# Patient Record
Sex: Male | Born: 1960 | Race: White | Hispanic: No | Marital: Married | Smoking: Never smoker
Health system: Southern US, Community
[De-identification: ages and names within clinical notes are randomized; demographics above are authoritative.]

## PROBLEM LIST (undated history)

## (undated) DIAGNOSIS — I2510 Atherosclerotic heart disease of native coronary artery without angina pectoris: Secondary | ICD-10-CM

## (undated) DIAGNOSIS — K219 Gastro-esophageal reflux disease without esophagitis: Secondary | ICD-10-CM

## (undated) DIAGNOSIS — C61 Malignant neoplasm of prostate: Secondary | ICD-10-CM

## (undated) DIAGNOSIS — F419 Anxiety disorder, unspecified: Secondary | ICD-10-CM

## (undated) DIAGNOSIS — E119 Type 2 diabetes mellitus without complications: Secondary | ICD-10-CM

## (undated) DIAGNOSIS — Z972 Presence of dental prosthetic device (complete) (partial): Secondary | ICD-10-CM

## (undated) DIAGNOSIS — M1990 Unspecified osteoarthritis, unspecified site: Secondary | ICD-10-CM

## (undated) DIAGNOSIS — Z973 Presence of spectacles and contact lenses: Secondary | ICD-10-CM

## (undated) DIAGNOSIS — I10 Essential (primary) hypertension: Secondary | ICD-10-CM

## (undated) DIAGNOSIS — E785 Hyperlipidemia, unspecified: Secondary | ICD-10-CM

## (undated) HISTORY — DX: Anxiety disorder, unspecified: F41.9

## (undated) HISTORY — DX: Type 2 diabetes mellitus without complications: E11.9

## (undated) HISTORY — PX: TENDON REPAIR: SHX5111

## (undated) HISTORY — PX: HERNIA REPAIR: SHX51

---

## 1998-09-20 ENCOUNTER — Emergency Department (HOSPITAL_COMMUNITY): Admission: EM | Admit: 1998-09-20 | Discharge: 1998-09-20 | Payer: Self-pay | Admitting: Emergency Medicine

## 2007-07-15 ENCOUNTER — Emergency Department (HOSPITAL_COMMUNITY): Admission: EM | Admit: 2007-07-15 | Discharge: 2007-07-15 | Payer: Self-pay | Admitting: Emergency Medicine

## 2009-06-13 ENCOUNTER — Emergency Department (HOSPITAL_COMMUNITY): Admission: EM | Admit: 2009-06-13 | Discharge: 2009-06-13 | Payer: Self-pay | Admitting: Emergency Medicine

## 2011-03-12 LAB — GLUCOSE, CAPILLARY: Glucose-Capillary: 110 mg/dL — ABNORMAL HIGH (ref 70–99)

## 2011-09-18 LAB — URINALYSIS, ROUTINE W REFLEX MICROSCOPIC
Hgb urine dipstick: NEGATIVE
Nitrite: NEGATIVE
Protein, ur: NEGATIVE
Specific Gravity, Urine: <1.005 — ABNORMAL LOW
Urobilinogen, UA: 0.2

## 2015-07-22 ENCOUNTER — Emergency Department (HOSPITAL_COMMUNITY)
Admission: EM | Admit: 2015-07-22 | Discharge: 2015-07-22 | Disposition: A | Discharge status: Home / Self Care | Payer: Worker's Compensation | Attending: Physician Assistant | Admitting: Physician Assistant

## 2015-07-22 ENCOUNTER — Encounter (HOSPITAL_COMMUNITY): Payer: Self-pay | Admitting: Emergency Medicine

## 2015-07-22 DIAGNOSIS — Z79899 Other long term (current) drug therapy: Secondary | ICD-10-CM | POA: Diagnosis not present

## 2015-07-22 DIAGNOSIS — Y9289 Other specified places as the place of occurrence of the external cause: Secondary | ICD-10-CM | POA: Insufficient documentation

## 2015-07-22 DIAGNOSIS — Z880 Allergy status to penicillin: Secondary | ICD-10-CM | POA: Insufficient documentation

## 2015-07-22 DIAGNOSIS — Y998 Other external cause status: Secondary | ICD-10-CM | POA: Insufficient documentation

## 2015-07-22 DIAGNOSIS — S0592XA Unspecified injury of left eye and orbit, initial encounter: Secondary | ICD-10-CM

## 2015-07-22 DIAGNOSIS — W228XXA Striking against or struck by other objects, initial encounter: Secondary | ICD-10-CM | POA: Diagnosis not present

## 2015-07-22 DIAGNOSIS — Y9389 Activity, other specified: Secondary | ICD-10-CM | POA: Insufficient documentation

## 2015-07-22 DIAGNOSIS — I10 Essential (primary) hypertension: Secondary | ICD-10-CM | POA: Diagnosis not present

## 2015-07-22 HISTORY — DX: Essential (primary) hypertension: I10

## 2015-07-22 MED ORDER — ERYTHROMYCIN 5 MG/GM OP OINT
TOPICAL_OINTMENT | Freq: Four times a day (QID) | OPHTHALMIC | Status: DC
  Administered 2015-07-22: 20:00:00 via OPHTHALMIC
  Filled 2015-07-22: qty 3.5

## 2015-07-22 MED ORDER — FLUORESCEIN SODIUM 1 MG OP STRP
ORAL_STRIP | OPHTHALMIC | Status: AC
  Administered 2015-07-22: 20:00:00
  Filled 2015-07-22: qty 1

## 2015-07-22 MED ORDER — OXYCODONE-ACETAMINOPHEN 5-325 MG PO TABS: 2.0000 | ORAL_TABLET | ORAL | Status: DC | PRN

## 2015-07-22 MED ORDER — TETRACAINE HCL 0.5 % OP SOLN
1.0000 [drp] | Freq: Once | OPHTHALMIC | Status: AC
  Administered 2015-07-22: 1 [drp] via OPHTHALMIC

## 2015-07-22 MED ORDER — TETRACAINE HCL 0.5 % OP SOLN
OPHTHALMIC | Status: AC
  Administered 2015-07-22: 1 [drp] via OPHTHALMIC
  Filled 2015-07-22: qty 2

## 2015-07-22 NOTE — ED Notes (Signed)
Irrigated r eye with 68ml nss.  Pt tolerated well.
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
## 2015-07-22 NOTE — Discharge Instructions (Signed)
Use the eye ointment every 6 hours and follow up with your eye doctor tomorrow. Take ibuprofen for pain and inflammation. Do not take the narcotic if driving as it will make you sleepy.
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
## 2015-08-04 MED FILL — Oxycodone w/ Acetaminophen Tab 5-325 MG: ORAL | Qty: 6 | Status: AC

## 2020-06-30 DIAGNOSIS — E1142 Type 2 diabetes mellitus with diabetic polyneuropathy: Secondary | ICD-10-CM | POA: Insufficient documentation

## 2020-06-30 DIAGNOSIS — E782 Mixed hyperlipidemia: Secondary | ICD-10-CM | POA: Insufficient documentation

## 2020-06-30 DIAGNOSIS — I10 Essential (primary) hypertension: Secondary | ICD-10-CM | POA: Insufficient documentation

## 2020-06-30 DIAGNOSIS — E1122 Type 2 diabetes mellitus with diabetic chronic kidney disease: Secondary | ICD-10-CM | POA: Insufficient documentation

## 2020-06-30 DIAGNOSIS — E669 Obesity, unspecified: Secondary | ICD-10-CM | POA: Insufficient documentation

## 2020-08-30 ENCOUNTER — Other Ambulatory Visit: Payer: Self-pay

## 2020-08-30 ENCOUNTER — Encounter: Payer: Self-pay | Admitting: Urology

## 2020-08-30 ENCOUNTER — Ambulatory Visit (INDEPENDENT_AMBULATORY_CARE_PROVIDER_SITE_OTHER): Payer: BC Managed Care – PPO | Admitting: Urology

## 2020-08-30 VITALS — BP 144/78 | HR 71 | Temp 98.0°F | Ht 73.0 in | Wt 257.0 lb

## 2020-08-30 DIAGNOSIS — R972 Elevated prostate specific antigen [PSA]: Secondary | ICD-10-CM | POA: Insufficient documentation

## 2020-08-30 LAB — URINALYSIS, ROUTINE W REFLEX MICROSCOPIC
Bilirubin, UA: NEGATIVE
Ketones, UA: NEGATIVE
Leukocytes,UA: NEGATIVE
Nitrite, UA: NEGATIVE
Protein,UA: NEGATIVE
RBC, UA: NEGATIVE
Specific Gravity, UA: 1.02 (ref 1.005–1.030)
Urobilinogen, Ur: 1 mg/dL (ref 0.2–1.0)
pH, UA: 5.5 (ref 5.0–7.5)

## 2020-08-30 LAB — BLADDER SCAN AMB NON-IMAGING: Scan Result: 28

## 2020-08-30 MED ORDER — DIAZEPAM 10 MG PO TABS: 10.0000 mg | ORAL_TABLET | Freq: Once | ORAL | 0 refills | Status: AC

## 2020-08-30 MED ORDER — LEVOFLOXACIN 750 MG PO TABS: 750.0000 mg | ORAL_TABLET | Freq: Every day | ORAL | 0 refills | Status: DC

## 2020-08-30 NOTE — Patient Instructions (Addendum)
Transrectal Ultrasound-Guided Prostate Biopsy A transrectal ultrasound-guided prostate biopsy is a procedure to remove samples of prostate tissue for testing. The procedure uses ultrasound images to guide the process of removing the samples. The samples are taken to a lab to be checked for prostate cancer. This procedure is usually done to evaluate the prostate gland of men who have raised (elevated) levels of prostate-specific antigen (PSA), which can be a sign of prostate cancer. Tell a health care provider about:  Any allergies you have.  All medicines you are taking, including vitamins, herbs, eye drops, creams, and over-the-counter medicines.  Any problems you or family members have had with anesthetic medicines.  Any blood disorders you have.  Any surgeries you have had.  Any medical conditions you have. What are the risks? Generally, this is a safe procedure. However, problems may occur, including:  Prostate infection.  Bleeding from the rectum.  Blood in the urine.  Allergic reactions to medicines.  Damage to surrounding structures such as blood vessels, organs, or muscles.  Difficulty passing urine.  Nerve damage. This is usually temporary.  Pain. What happens before the procedure? Staying hydrated Follow instructions from your health care provider about hydration, which may include:  Up to 2 hours before the procedure - you may continue to drink clear liquids, such as water, clear fruit juice, black coffee, and plain tea.  Eating and drinking restrictions Follow instructions from your health care provider about eating and drinking, which may include:  8 hours before the procedure - stop eating heavy meals or foods such as meat, fried foods, or fatty foods.  6 hours before the procedure - stop eating light meals or foods, such as toast or cereal.  6 hours before the procedure - stop drinking milk or drinks that contain milk.  2 hours before the procedure -  stop drinking clear liquids. Medicines Ask your health care provider about:  Changing or stopping your regular medicines. This is especially important if you are taking diabetes medicines or blood thinners.  Taking over-the-counter medicines, vitamins, herbs, and supplements.  Taking medicines such as aspirin and ibuprofen. These medicines can thin your blood. Do not take these medicines unless your health care provider tells you to take them. General instructions  You may be given antibiotic medicine to help prevent infection. If so, take the antibiotic as told by your health care provider.  You will be given an enema. During an enema, a liquid is injected into your rectum to clear out waste.  You may have a blood or urine sample taken.  Plan to have someone take you home from the hospital or clinic. What happens during the procedure?   To lower your risk of infection: ? Your health care team will wash or sanitize their hands. ? Hair may be removed from the surgical area. ? Your skin will be cleaned with a germ-killing soap. ? You may be given antibiotic medicine.  An IV will be inserted into one of your veins.  You will be given one or both of the following: ? A medicine to help you relax (sedative). ? A medicine to numb the area (local anesthetic).  You will be placed on your left side, and your knees will be bent toward your chest.  A probe with lubricated gel will be placed into your rectum, and images will be taken of your prostate and surrounding structures.  Numbing medicine will be injected into your prostate.  A biopsy needle will be inserted through  your rectum and guided to your prostate using the ultrasound images.  Prostate tissue samples will be removed, and the needle will then be removed.  The biopsy samples will be sent to a lab to be tested. The procedure may vary among health care providers and hospitals. What happens after the procedure?  Your blood  pressure, heart rate, breathing rate, and blood oxygen level will be monitored until the medicines you were given have worn off.  You may have some discomfort in the rectal area. You will be given pain medicine as needed.  Do not drive for 24 hours if you received a sedative. Summary  A transrectal ultrasound-guided biopsy removes samples of tissue from your prostate.  This procedure is usually done to evaluate the prostate gland of men who have raised (elevated) levels of prostate-specific antigen (PSA), which can be a sign of prostate cancer.  After your procedure, you may feel some discomfort in the rectal area.  Plan to have someone take you home from the hospital or clinic. This information is not intended to replace advice given to you by your health care provider. Make sure you discuss any questions you have with your health care provider. Document Revised: 03/12/2019 Document Reviewed: 02/16/2017 Elsevier Patient Education  [place].      Appointment Time: 12:45     Please arrive by 12:15 noon Appointment Date:  09/22/20  Location: [name]   Prostate Biopsy Instructions  Stop all aspirin or blood thinners (aspirin, plavix, coumadin, warfarin, motrin, ibuprofen, advil, aleve, naproxen, naprosyn) for 7 days prior to the procedure.  If you have any questions about stopping these medications, please contact your primary care physician or cardiologist.  Having a light meal prior to the procedure is recommended.  If you are diabetic or have low blood sugar please bring a small snack or glucose tablet.  A Fleets enema is needed to be purchased over the counter at a local pharmacy and used 2 hours before you scheduled appointment.  This can be purchased over the counter at any pharmacy.  Antibiotics will be administered in the clinic at the time of the procedure and 1 tablet has been sent to your pharmacy. Please take the antibiotic as prescribed.     Please bring someone with you to the procedure to drive you home if you are given a valium to take prior to your procedure.   If you have any questions or concerns, please feel free to call the office at [phone] or send a Mychart message.    Thank you, [facility]

## 2020-08-30 NOTE — Progress Notes (Signed)
Urological Symptom Review  Patient is experiencing the following symptoms: Frequent urination Get up at night to urinate Erection problems (male only)   Review of Systems  Gastrointestinal (upper)  : Negative for upper GI symptoms  Gastrointestinal (lower) : Negative for lower GI symptoms  Constitutional : Negative for symptoms  Skin: Negative for skin symptoms  Eyes: Negative for eye symptoms  Ear/Nose/Throat : Negative for Ear/Nose/Throat symptoms  Hematologic/Lymphatic: Negative for Hematologic/Lymphatic symptoms  Cardiovascular : Negative for cardiovascular symptoms  Respiratory : Negative for respiratory symptoms  Endocrine: Negative for endocrine symptoms  Musculoskeletal: Negative for musculoskeletal symptoms  Neurological: Negative for neurological symptoms  Psychologic: Negative for psychiatric symptoms  

## 2020-08-30 NOTE — Progress Notes (Signed)
08/30/2020 11:33 AM   [name] [birth year] [id number]  Referring provider: [address], [place] [address]  Elevated PSA  HPI: [name] is a 59yo here for evaluation of elevated PSA. PSA was 10.1 earlier this year. He denies any significant LUTS. He does have nocturia 1-2x.  No family hx of prostate cancer.    PMH: Past Medical History:  Diagnosis Date   Anxiety    Diabetes mellitus without complication ([place])    Hypertension     Surgical History: Past Surgical History:  Procedure Laterality Date   HERNIA REPAIR     TENDON REPAIR Left    left arm    Home Medications:  Allergies as of 08/30/2020      Reactions   Penicillins Swelling, Rash      Medication List       Accurate as of August 30, 2020 11:33 AM. If you have any questions, ask your nurse or doctor.        STOP taking these medications   divalproex 250 MG 24 hr tablet Commonly known as: DEPAKOTE ER Stopped by: Nicolette Bang, MD     TAKE these medications   amLODipine-benazepril 10-20 MG capsule Commonly known as: LOTREL Take 1 capsule by mouth daily.   atorvastatin 40 MG tablet Commonly known as: LIPITOR Take 40 mg by mouth daily.   Bayer Aspirin EC Low Dose 81 MG EC tablet Generic drug: aspirin Take 81 mg by mouth daily.   FLUoxetine 40 MG capsule Commonly known as: PROZAC Take 40 mg by mouth daily.   glipiZIDE 5 MG tablet Commonly known as: GLUCOTROL Take 5 mg by mouth daily.   metFORMIN 1000 MG tablet Commonly known as: GLUCOPHAGE Take 1,000 mg by mouth 2 (two) times daily.   omeprazole 40 MG capsule Commonly known as: PRILOSEC Take 40 mg by mouth daily.   omeprazole-sodium bicarbonate 40-1100 MG capsule Commonly known as: ZEGERID Take by mouth.   oxyCODONE-acetaminophen 5-325 MG tablet Commonly known as: PERCOCET/ROXICET Take 2 tablets by mouth every 4 (four) hours as needed for severe pain.        Allergies:  Allergies  Allergen Reactions   Penicillins Swelling and Rash    Family History: History reviewed. No pertinent family history.  Social History:  reports that he has never smoked. He has quit using smokeless tobacco. He reports current alcohol use. He reports that he does not use drugs.  ROS: All other review of systems were reviewed and are negative except what is noted above in HPI  Physical Exam: BP (!) 144/78    Pulse 71    Temp 98 F (36.7 C)    Ht 6\' 1"  (1.854 m)    Wt 257 lb (116.6 kg)    BMI 33.91 kg/m   Constitutional:  Alert and oriented, No acute distress. HEENT: [place] AT, moist mucus membranes.  Trachea midline, no masses. Cardiovascular: No clubbing, cyanosis, or edema. Respiratory: Normal respiratory effort, no increased work of breathing. GI: Abdomen is soft, nontender, nondistended, no abdominal masses GU: No CVA tenderness. Circumcised phallus. No masses/lesions on penis, testis, scrotum. Prostate 40g smooth no nodules no induration.  Lymph: No cervical or inguinal lymphadenopathy. Skin: No rashes, bruises or suspicious lesions. Neurologic: Grossly intact, no focal deficits, moving all 4 extremities. Psychiatric: Normal mood and affect.  Laboratory Data: No results found for: WBC, HGB, HCT, MCV, PLT  No results found for: CREATININE  No results found for: PSA  No results found for: TESTOSTERONE  No results found for: HGBA1C  Urinalysis    Component Value Date/Time   COLORURINE YELLOW 07/15/2007 [place] 07/15/2007 0841   LABSPEC <1.005 (L) 07/15/2007 0841   PHURINE 6.0 07/15/2007 0841   GLUCOSEU NEGATIVE 07/15/2007 0841   HGBUR NEGATIVE 07/15/2007 0841   BILIRUBINUR NEGATIVE 07/15/2007 0841   KETONESUR NEGATIVE 07/15/2007 0841   PROTEINUR NEGATIVE 07/15/2007 0841   UROBILINOGEN 0.2 07/15/2007 0841   NITRITE NEGATIVE 07/15/2007 0841   LEUKOCYTESUR  07/15/2007 0841    NEGATIVE MICROSCOPIC NOT DONE ON URINES WITH  NEGATIVE PROTEIN, BLOOD, LEUKOCYTES, NITRITE, OR GLUCOSE <1000 mg/dL.    No results found for: LABMICR, [place], RBCUA, LABEPIT, MUCUS, BACTERIA  Pertinent Imaging:  No results found for this or any previous visit.  No results found for this or any previous visit.  No results found for this or any previous visit.  No results found for this or any previous visit.  No results found for this or any previous visit.  No results found for this or any previous visit.  No results found for this or any previous visit.  No results found for this or any previous visit.   Assessment & Plan:    1. Elevated PSA -The patient and I talked about etiologies of elevated PSA.  We discussed the possible relationship between elevated PSA, prostate cancer, BPH, prostatitis, and UTI.   Conservative treatment of elevated PSA with watchful waiting was discussed with the patient.  All questions were answered.        All of the risks and benefits along with alternatives to prostate biopsy were discussed with the patient.  The patient gave fully informed consent to proceed with a transrectal ultrasound guided biopsy of the prostate for the evaluation of their evated PSA.  Prostate biopsy instructions and antibiotics were given to the patient.  - Urinalysis, Routine w reflex microscopic   No follow-ups on file.  Nicolette Bang, MD  [facility] [place]

## 2020-09-13 ENCOUNTER — Other Ambulatory Visit: Payer: BC Managed Care – PPO | Admitting: Urology

## 2020-09-20 ENCOUNTER — Telehealth: Payer: Self-pay | Admitting: Urology

## 2020-09-20 NOTE — Telephone Encounter (Signed)
Pt needs to reschedule his biopsy appt.

## 2020-09-21 ENCOUNTER — Ambulatory Visit: Payer: BC Managed Care – PPO | Admitting: Urology

## 2020-09-21 NOTE — Telephone Encounter (Signed)
Called pt back and left message to return call to the office. Will cancel with centralized scheduled and move to November 11/10.

## 2020-09-22 ENCOUNTER — Ambulatory Visit (HOSPITAL_COMMUNITY): Payer: BC Managed Care – PPO

## 2020-09-22 ENCOUNTER — Other Ambulatory Visit: Payer: BC Managed Care – PPO | Admitting: Urology

## 2020-09-29 ENCOUNTER — Ambulatory Visit: Payer: BC Managed Care – PPO | Admitting: Urology

## 2020-10-13 ENCOUNTER — Other Ambulatory Visit: Payer: Self-pay | Admitting: Urology

## 2020-10-13 ENCOUNTER — Ambulatory Visit (HOSPITAL_COMMUNITY)
Admission: RE | Admit: 2020-10-13 | Discharge: 2020-10-13 | Disposition: A | Discharge status: Home / Self Care | Payer: BC Managed Care – PPO | Source: Ambulatory Visit | Attending: Urology | Admitting: Urology

## 2020-10-13 ENCOUNTER — Other Ambulatory Visit: Payer: Self-pay

## 2020-10-13 ENCOUNTER — Encounter: Payer: Self-pay | Admitting: Urology

## 2020-10-13 ENCOUNTER — Ambulatory Visit (INDEPENDENT_AMBULATORY_CARE_PROVIDER_SITE_OTHER): Payer: BC Managed Care – PPO | Admitting: Urology

## 2020-10-13 DIAGNOSIS — C61 Malignant neoplasm of prostate: Secondary | ICD-10-CM | POA: Diagnosis not present

## 2020-10-13 DIAGNOSIS — R972 Elevated prostate specific antigen [PSA]: Secondary | ICD-10-CM

## 2020-10-13 MED ORDER — GENTAMICIN SULFATE 40 MG/ML IJ SOLN
INTRAMUSCULAR | Status: AC
  Administered 2020-10-13: 80 mg via INTRAMUSCULAR
  Filled 2020-10-13: qty 2

## 2020-10-13 MED ORDER — LIDOCAINE HCL 2 % IJ SOLN
10.0000 mL | Freq: Once | INTRAMUSCULAR | Status: AC
  Administered 2020-10-13: 10 mL via INTRADERMAL

## 2020-10-13 MED ORDER — GENTAMICIN SULFATE 40 MG/ML IJ SOLN: 80.0000 mg | Freq: Once | INTRAMUSCULAR | Status: AC

## 2020-10-13 NOTE — Sedation Documentation (Signed)
PT tolerated prostate biopsy procedure well today. Labs obtained and sent for pathology. PT ambulatory at discharge with no acute distress noted and verbalized understanding of discharge instructions. PT to follow up with urologist as scheduled. 

## 2020-10-13 NOTE — Discharge Instructions (Signed)

## 2020-10-13 NOTE — Patient Instructions (Signed)

## 2020-10-13 NOTE — Progress Notes (Signed)
Prostate Biopsy Procedure   Informed consent was obtained after discussing risks/benefits of the procedure.  A time out was performed to ensure correct patient identity.  Pre-Procedure: - Last PSA Level: No results found for: PSA - Gentamicin given prophylactically - Levaquin 500 mg administered PO -Transrectal Ultrasound performed revealing a 36.2 gm prostate -No significant hypoechoic or median lobe noted  Procedure: - Prostate block performed using 10 cc 1% lidocaine and biopsies taken from sextant areas, a total of 12 under ultrasound guidance.  Post-Procedure: - Patient tolerated the procedure well - He was counseled to seek immediate medical attention if experiences any severe pain, significant bleeding, or fevers - Return in one week to discuss biopsy results

## 2020-10-21 ENCOUNTER — Ambulatory Visit (INDEPENDENT_AMBULATORY_CARE_PROVIDER_SITE_OTHER): Payer: BC Managed Care – PPO | Admitting: Urology

## 2020-10-21 ENCOUNTER — Other Ambulatory Visit: Payer: Self-pay

## 2020-10-21 ENCOUNTER — Encounter: Payer: Self-pay | Admitting: Urology

## 2020-10-21 VITALS — BP 136/83 | HR 76 | Temp 98.1°F | Ht 73.0 in | Wt 257.0 lb

## 2020-10-21 DIAGNOSIS — C61 Malignant neoplasm of prostate: Secondary | ICD-10-CM

## 2020-10-21 NOTE — Patient Instructions (Signed)
Prostate Cancer  The prostate is a male gland that helps make semen. Prostate cancer is when abnormal cells grow in this gland. Follow these instructions at home:  Take over-the-counter and prescription medicines only as told by your doctor.  Eat a healthy diet.  Get plenty of sleep.  Ask your doctor for help to find a support group for men with prostate cancer.  Keep all follow-up visits as told by your doctor. This is important.  If you have to go to the hospital, let your cancer doctor (oncologist) know.  Touch, hold, hug, and caress your partner to continue to show sexual feelings. Contact a doctor if:  You have trouble peeing (urinating).  You have blood in your pee (urine).  You have pain in your hips, back, or chest. Get help right away if:  You have weakness in your legs.  You lose feeling (have numbness) in your legs.  You cannot control your pee or your poop (stool).  You have trouble breathing.  You have sudden pain in your chest.  You have chills or a fever. Summary  The prostate is a male gland that helps make semen. Prostate cancer is when abnormal cells grow in this gland.  Ask your doctor for help to find a support group for men with prostate cancer.  Contact a doctor if you have problems peeing or have any new pain that you did not have before. This information is not intended to replace advice given to you by your health care provider. Make sure you discuss any questions you have with your health care provider. Document Revised: 11/02/2017 Document Reviewed: 07/31/2016 Elsevier Patient Education  2020 [employer]  

## 2020-10-21 NOTE — Progress Notes (Signed)

## 2020-10-21 NOTE — Progress Notes (Signed)
10/21/2020 4:08 PM   [name] 17-Feb-1961 [id number]  Referring provider: [facility], [place] [address]  followup prostate biopsy  HPI: [name] is a 59yo here for followup after prostate biopsy. Biopsy revealed Gleason 4+4=8 in 2/12 cores, Gleason 4+3=7 in 1/12 cores, Gleason 3+4=7 n 3/12 cores, and Gleason 3+3=6 in 1/12 cores. PSA 10.1. Mild LUTS. Mild ED    PMH: Past Medical History:  Diagnosis Date  . Anxiety   . Diabetes mellitus without complication ([place])   . Hypertension     Surgical History: Past Surgical History:  Procedure Laterality Date  . HERNIA REPAIR    . TENDON REPAIR Left    left arm    Home Medications:  Allergies as of 10/21/2020      Reactions   Penicillins Swelling, Rash      Medication List       Accurate as of October 21, 2020  4:08 PM. If you have any questions, ask your nurse or doctor.        amLODipine-benazepril 10-20 MG capsule Commonly known as: LOTREL Take 1 capsule by mouth daily.   atorvastatin 40 MG tablet Commonly known as: LIPITOR Take 40 mg by mouth daily.   Bayer Aspirin EC Low Dose 81 MG EC tablet Generic drug: aspirin Take 81 mg by mouth daily.   FLUoxetine 40 MG capsule Commonly known as: PROZAC Take 40 mg by mouth daily.   glipiZIDE 5 MG tablet Commonly known as: GLUCOTROL Take 5 mg by mouth daily.   Jardiance 25 MG Tabs tablet Generic drug: empagliflozin Take 25 mg by mouth daily.   levofloxacin 750 MG tablet Commonly known as: Levaquin Take 1 tablet (750 mg total) by mouth daily. Take the morning of your procedure   metFORMIN 1000 MG tablet Commonly known as: GLUCOPHAGE Take 1,000 mg by mouth 2 (two) times daily.   omeprazole 40 MG capsule Commonly known as: PRILOSEC Take 40 mg by mouth daily.   omeprazole-sodium bicarbonate 40-1100 MG capsule Commonly known as: ZEGERID Take by mouth.   oxyCODONE-acetaminophen 5-325 MG tablet Commonly  known as: PERCOCET/ROXICET Take 2 tablets by mouth every 4 (four) hours as needed for severe pain.       Allergies:  Allergies  Allergen Reactions  . Penicillins Swelling and Rash    Family History: No family history on file.  Social History:  reports that he has never smoked. He has quit using smokeless tobacco. He reports current alcohol use. He reports that he does not use drugs.  ROS: All other review of systems were reviewed and are negative except what is noted above in HPI  Physical Exam: BP 136/83   Pulse 76   Temp 98.1 F (36.7 C)   Ht 6\' 1"  (1.854 m)   Wt 257 lb (116.6 kg)   BMI 33.91 kg/m   Constitutional:  Alert and oriented, No acute distress. HEENT: [place] AT, moist mucus membranes.  Trachea midline, no masses. Cardiovascular: No clubbing, cyanosis, or edema. Respiratory: Normal respiratory effort, no increased work of breathing. GI: Abdomen is soft, nontender, nondistended, no abdominal masses GU: No CVA tenderness.  Lymph: No cervical or inguinal lymphadenopathy. Skin: No rashes, bruises or suspicious lesions. Neurologic: Grossly intact, no focal deficits, moving all 4 extremities. Psychiatric: Normal mood and affect.  Laboratory Data: No results found for: WBC, HGB, HCT, MCV, PLT  No results found for: CREATININE  No results found for: PSA  No results found for:  TESTOSTERONE  No results found for: HGBA1C  Urinalysis    Component Value Date/Time   COLORURINE YELLOW 07/15/2007 0841   APPEARANCEUR Clear 08/30/2020 1149   LABSPEC <1.005 (L) 07/15/2007 0841   PHURINE 6.0 07/15/2007 0841   GLUCOSEU Trace (A) 08/30/2020 1149   HGBUR NEGATIVE 07/15/2007 0841   BILIRUBINUR Negative 08/30/2020 [place] 07/15/2007 0841   PROTEINUR Negative 08/30/2020 1149   PROTEINUR NEGATIVE 07/15/2007 0841   UROBILINOGEN 0.2 07/15/2007 0841   NITRITE Negative 08/30/2020 1149   NITRITE NEGATIVE 07/15/2007 0841   LEUKOCYTESUR Negative 08/30/2020  1149    Lab Results  Component Value Date   LABMICR Comment 08/30/2020    Pertinent Imaging:  No results found for this or any previous visit.  No results found for this or any previous visit.  No results found for this or any previous visit.  No results found for this or any previous visit.  No results found for this or any previous visit.  No results found for this or any previous visit.  No results found for this or any previous visit.  No results found for this or any previous visit.   Assessment & Plan:    1. Prostate cancer [facility]) I discussed the natural history of high risk prostate cancer with the patient and the various treatment options including active surveillance, RALP, IMRT, brachytherapy, cryotherapy, HIFU and ADT. After discussing the options the patient the patient is undecided. I will refer him to Dr. Tresa Moore for consideration of RALP and Dr. Tammi Klippel for consideration IMRT/brachytherapy. I will obtain a CT and bone scan prior to his appointments.    No follow-ups on file.  Nicolette Bang, MD  [facility] [place]

## 2020-10-22 LAB — BASIC METABOLIC PANEL
BUN/Creatinine Ratio: 17 (ref 10–24)
BUN: 21 mg/dL (ref 8–27)
CO2: 23 mmol/L (ref 20–29)
Calcium: 9.4 mg/dL (ref 8.6–10.2)
Chloride: 102 mmol/L (ref 96–106)
Creatinine, Ser: 1.21 mg/dL (ref 0.76–1.27)
GFR calc Af Amer: 75 mL/min/{1.73_m2} (ref >59)
GFR calc non Af Amer: 65 mL/min/{1.73_m2} (ref >59)
Glucose: 95 mg/dL (ref 65–99)
Potassium: 4.7 mmol/L (ref 3.5–5.2)
Sodium: 140 mmol/L (ref 134–144)

## 2020-10-27 NOTE — Progress Notes (Signed)
Letter mailed

## 2020-10-29 ENCOUNTER — Ambulatory Visit (HOSPITAL_COMMUNITY)
Admission: RE | Admit: 2020-10-29 | Discharge: 2020-10-29 | Disposition: A | Discharge status: Home / Self Care | Payer: BC Managed Care – PPO | Source: Ambulatory Visit | Attending: Urology | Admitting: Urology

## 2020-10-29 ENCOUNTER — Other Ambulatory Visit: Payer: Self-pay

## 2020-10-29 DIAGNOSIS — C61 Malignant neoplasm of prostate: Secondary | ICD-10-CM

## 2020-10-29 MED ORDER — IOHEXOL 300 MG/ML  SOLN
100.0000 mL | Freq: Once | INTRAMUSCULAR | Status: AC | PRN
  Administered 2020-10-29: 100 mL via INTRAVENOUS

## 2020-11-02 ENCOUNTER — Encounter (HOSPITAL_COMMUNITY)
Admission: RE | Admit: 2020-11-02 | Discharge: 2020-11-02 | Disposition: A | Discharge status: Home / Self Care | Payer: BC Managed Care – PPO | Source: Ambulatory Visit | Attending: Urology | Admitting: Urology

## 2020-11-02 ENCOUNTER — Other Ambulatory Visit: Payer: Self-pay

## 2020-11-02 ENCOUNTER — Encounter (HOSPITAL_COMMUNITY): Payer: Self-pay

## 2020-11-02 DIAGNOSIS — C61 Malignant neoplasm of prostate: Secondary | ICD-10-CM | POA: Insufficient documentation

## 2020-11-02 MED ORDER — TECHNETIUM TC 99M MEDRONATE IV KIT
20.0000 | PACK | Freq: Once | INTRAVENOUS | Status: AC | PRN
  Administered 2020-11-02: 20.6 via INTRAVENOUS

## 2020-11-03 HISTORY — PX: PROSTATE BIOPSY: SHX241

## 2020-11-11 ENCOUNTER — Encounter: Payer: Self-pay | Admitting: Radiation Oncology

## 2020-11-11 NOTE — Progress Notes (Signed)
GU Location of Tumor / Histology: prostatic adenocarcinoma  If Prostate Cancer, Gleason Score is (4 + 4) and PSA is (10.1). 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 Patient explains that his PCP was concerned about his kidneys based on Septembers bloodwork and referred him to Dr. Alyson Ingles. Patient explains that Dr. Alyson Ingles told him is kidneys are fine but his PSA was elevated.  Biopsies of prostate (if applicable) revealed:    Past/Anticipated interventions by urology, if any: prostate biopsy, CT abd/pelvic negative, bone scan negative, referral to Dr. Tresa Moore to discuss prostatectomy, referral to Dr. Tammi Klippel to discuss brachytherapy  Past/Anticipated interventions by medical oncology, if any: no  Weight changes, if any: no  Bowel/Bladder complaints, if any: IPSS 4. SHIM 10. Mild LUTS. Mild ED. Denies any bowel complaints.  Nausea/Vomiting, if any: no  Pain issues, if any:  denies  SAFETY ISSUES:  Prior radiation? denies  Pacemaker/ICD? denies  Possible current pregnancy? no, male patient  Is the patient on methotrexate? 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

## 2020-11-12 ENCOUNTER — Ambulatory Visit
Admission: RE | Admit: 2020-11-12 | Discharge: 2020-11-12 | Disposition: A | Discharge status: Home / Self Care | Payer: BC Managed Care – PPO | Source: Ambulatory Visit | Attending: Radiation Oncology | Admitting: Radiation Oncology

## 2020-11-12 ENCOUNTER — Other Ambulatory Visit: Payer: Self-pay

## 2020-11-12 ENCOUNTER — Encounter: Payer: Self-pay | Admitting: Radiation Oncology

## 2020-11-12 VITALS — Ht 73.0 in | Wt 258.0 lb

## 2020-11-12 DIAGNOSIS — C61 Malignant neoplasm of prostate: Secondary | ICD-10-CM

## 2020-11-12 HISTORY — DX: Malignant neoplasm of prostate: C61

## 2020-11-12 NOTE — Progress Notes (Signed)
Radiation Oncology         [phone] ________________________________  Initial Outpatient Consultation - Conducted via Telephone due to current COVID-19 concerns for limiting patient exposure  Name: [name] MRN: [id number]  Date: 11/12/2020  DOB: 28-Mar-1961  CC:Vasireddy, Lanetta Inch, MD  McKenzie, Candee Furbish, MD   REFERRING PHYSICIAN: Cleon Gustin, MD  DIAGNOSIS: 59 y.o. gentleman with Stage T1c adenocarcinoma of the prostate with Gleason score of 4+4, and PSA of 10.1.    ICD-10-CM   1. Prostate cancer [facility])  C61     HISTORY OF PRESENT ILLNESS: Derrick Murray is a 59 y.o. male with a diagnosis of prostate cancer. He was noted to have an elevated PSA of 10.1 by his primary care physician, Dr. Lunette Stands.  Accordingly, he was referred for evaluation in urology by Dr. Alyson Ingles on 08/30/2020,  digital rectal examination was performed at that time revealing no nodules.  The patient proceeded to transrectal ultrasound with 12 biopsies of the prostate on 10/13/2020.  The prostate volume measured 36.2 cc.  Out of 12 core biopsies, 7 were positive, including all right-sided cores.  The maximum Gleason score was 4+4, and this was seen in right mid lateral. Additionally, Gleason 4+3 was seen in right apex lateral and right apex, Gleason 3+4 in right base lateral, right mid, and right base, a small focus of Gleason 3+3 in left apex lateral.  He underwent CT A/P on 10/29/2020 showing: no evidence of adenopathy or osseous metastatic disease. Bone scan performed on 11/02/2020 also showed no osseous metastatic disease.  The patient reviewed the biopsy results with his urologist and he has kindly been referred today for discussion of potential radiation treatment options.   PREVIOUS RADIATION THERAPY: No  PAST MEDICAL HISTORY:  Past Medical History:  Diagnosis Date  . Anxiety   . Diabetes mellitus without complication ([place])   . Hypertension   . Prostate cancer ([place])        PAST SURGICAL HISTORY: Past Surgical History:  Procedure Laterality Date  . HERNIA REPAIR    . PROSTATE BIOPSY    . TENDON REPAIR Left    left arm    FAMILY HISTORY:  Family History  Problem Relation Age of Onset  . Colon cancer Maternal Grandfather   . Heart attack Maternal Grandfather   . Breast cancer Neg Hx   . Prostate cancer Neg Hx   . Pancreatic cancer Neg Hx     SOCIAL HISTORY:  Social History   Socioeconomic History  . Marital status: Legally Separated    Spouse name: Not on file  . Number of children: 1  . Years of education: Not on file  . Highest education level: Not on file  Occupational History  . Occupation: truck Geophysicist/field seismologist    Comment: full time  Tobacco Use  . Smoking status: Never Smoker  . Smokeless tobacco: Former Systems developer    Types: Secondary school teacher  . Vaping Use: Never used  Substance and Sexual Activity  . Alcohol use: Yes    Comment: 1- 2 beers a week  . Drug use: No  . Sexual activity: Yes  Other Topics Concern  . Not on file  Social History Narrative  . Not on file   Social Determinants of Health   Financial Resource Strain: Not on file  Food Insecurity: Not on file  Transportation Needs: Not on file  Physical Activity: Not on file  Stress: Not on file  Social Connections: Not on file  Intimate Partner Violence: Not on file    ALLERGIES: Penicillins  MEDICATIONS:  Current Outpatient Medications  Medication Sig Dispense Refill  . amLODipine-benazepril (LOTREL) 10-20 MG per capsule Take 1 capsule by mouth daily.    Marland Kitchen atorvastatin (LIPITOR) 40 MG tablet Take 40 mg by mouth daily.    Marland Kitchen BAYER ASPIRIN EC LOW DOSE 81 MG EC tablet Take 81 mg by mouth daily.    Marland Kitchen FLUoxetine (PROZAC) 40 MG capsule Take 40 mg by mouth daily.    Marland Kitchen glipiZIDE (GLUCOTROL) 5 MG tablet Take 5 mg by mouth daily.    Marland Kitchen JARDIANCE 25 MG TABS tablet Take 25 mg by mouth daily.    . metFORMIN (GLUCOPHAGE) 1000 MG tablet Take 1,000 mg by mouth 2 (two) times daily.    Marland Kitchen  omeprazole (PRILOSEC) 40 MG capsule Take 40 mg by mouth daily.     No current facility-administered medications for this encounter.    REVIEW OF SYSTEMS:  On review of systems, the patient reports that he is doing well overall. He denies any chest pain, shortness of breath, cough, fevers, chills, night sweats, unintended weight changes. He denies any bowel disturbances, and denies abdominal pain, nausea or vomiting. He denies any new musculoskeletal or joint aches or pains. His IPSS was 4, indicating mild urinary symptoms. His SHIM was 10, indicating he does have moderate erectile dysfunction. A complete review of systems is obtained and is otherwise negative.    PHYSICAL EXAM:  Wt Readings from Last 3 Encounters:  11/12/20 258 lb (117 kg)  10/21/20 257 lb (116.6 kg)  08/30/20 257 lb (116.6 kg)   Temp Readings from Last 3 Encounters:  10/21/20 98.1 F (36.7 C)  10/13/20 98.1 F (36.7 C) (Oral)  08/30/20 98 F (36.7 C)   BP Readings from Last 3 Encounters:  10/21/20 136/83  10/13/20 (!) 154/86  08/30/20 (!) 144/78   Pulse Readings from Last 3 Encounters:  10/21/20 76  10/13/20 75  08/30/20 71   Pain Assessment Pain Score: 0-No pain/10  Physical exam not performed in light of telephone encounter.   KPS = 100  100 - Normal; no complaints; no evidence of disease. 90   - Able to carry on normal activity; minor signs or symptoms of disease. 80   - Normal activity with effort; some signs or symptoms of disease. 32   - Cares for self; unable to carry on normal activity or to do active work. 60   - Requires occasional assistance, but is able to care for most of his personal needs. 50   - Requires considerable assistance and frequent medical care. 29   - Disabled; requires special care and assistance. 50   - Severely disabled; hospital admission is indicated although death not imminent. 42   - Very sick; hospital admission necessary; active supportive treatment necessary. 10    - Moribund; fatal processes progressing rapidly. 0     - Dead  Karnofsky DA, Abelmann [place], Craver LS and Burchenal [facility] [phone]) The use of the nitrogen mustards in the palliative treatment of carcinoma: with particular reference to bronchogenic carcinoma Cancer 1 [id number]  LABORATORY DATA:  No results found for: WBC, HGB, HCT, MCV, PLT Lab Results  Component Value Date   NA 140 10/21/2020   K 4.7 10/21/2020   CL 102 10/21/2020   CO2 23 10/21/2020   No results found for: ALT, AST, GGT, ALKPHOS, BILITOT   RADIOGRAPHY: CT Abdomen Pelvis W Wo Contrast  Result Date: 10/29/2020  CLINICAL DATA:  Prostate cancer staging EXAM: CT ABDOMEN AND PELVIS WITHOUT AND WITH CONTRAST TECHNIQUE: Multidetector CT imaging of the abdomen and pelvis was performed following the standard protocol before and following the bolus administration of intravenous contrast. CONTRAST:  174mL OMNIPAQUE IOHEXOL 300 MG/ML  SOLN COMPARISON:  None. FINDINGS: Lower chest: 1.6 by 0.8 cm posterior basal segment right lower lobe nodule with a 0.9 by 0.7 cm densely calcified central portion on image 9 of series 6. No compelling evidence of enhancement in the portion surrounding the dense calcification, this is probably from old granulomatous disease or possibly a pulmonary hamartoma. Left anterior descending, right, and circumflex coronary artery atherosclerotic vascular calcification. Distal esophageal wall thickening, esophagitis would be the most likely cause. Hepatobiliary: Unremarkable Pancreas: Unremarkable Spleen: Is unremarkable Adrenals/Urinary Tract: The adrenal glands appear unremarkable. 0.3 cm hypodense lesion in the right kidney lower pole on image 64 series 10 is technically too small to characterize although statistically likely to be a benign cyst. Similar small hypodense left renal lesions are probably cysts. No urinary tract calculi are identified. Stomach/Bowel: Unremarkable Vascular/Lymphatic: Aortoiliac atherosclerotic  vascular disease. Duplicated infrarenal IVC, a vascular variant. Reproductive: No abnormal eccentricity of the prostate gland is identified. No significant asymmetry of the seminal vesicles. No pathologic pelvic adenopathy. Other: No supplemental non-categorized findings. Musculoskeletal: In the subcutaneous tissues posterior to the left paraspinal musculature at about the L3 vertebral level, a 5.7 by 4.0 by 6.3 cm (volume = 75 cm^3) fluid density collection is present. Thoracic and lumbar spondylosis is present, with degenerative disc disease at the L4-5 level. No compelling findings of osseous metastatic disease. IMPRESSION: 1. No findings of adenopathy or osseous metastatic disease. 2. In the subcutaneous tissues posterior to the left paraspinal musculature at about the L3 vertebral level, a 75 cubic cm fluid density collection is present. 3. Distal esophageal wall thickening, esophagitis would be the most likely cause. 4. Other imaging findings of potential clinical significance: Coronary atherosclerosis. Duplicated infrarenal IVC, a vascular variant. Small hypodense renal lesions are likely cysts. Thoracic and lumbar spondylosis with degenerative disc disease at the L4-5 level. 1.6 by 0.8 cm posterior basal segment right lower lobe nodule with a densely calcified central portion, probably from old granulomatous disease or possibly a pulmonary hamartoma. 5. Aortic atherosclerosis. Aortic Atherosclerosis (ICD10-I70.0). Electronically Signed   By: Van Clines M.D.   On: 10/29/2020 14:14   NM Bone Scan Whole Body  Result Date: 11/03/2020 CLINICAL DATA:  Prostate cancer, initial staging examination EXAM: NUCLEAR MEDICINE WHOLE BODY BONE SCAN TECHNIQUE: Whole body anterior and posterior images were obtained approximately 3 hours after intravenous injection of radiopharmaceutical. RADIOPHARMACEUTICALS:  20.6 mCi Technetium-46m MDP IV COMPARISON:  None. FINDINGS: Whole body delayed stick graphic images were  obtained. Uptake within the shoulders, knees, and left elbow are likely degenerative in nature. No focal uptake identified to suggest metastatic disease. Normal soft tissue distribution. Normal uptake and excretion within the kidneys and bladder. IMPRESSION: No evidence of osseous metastatic disease. Electronically Signed   By: Fidela Salisbury MD   On: 11/03/2020 00:29      IMPRESSION/PLAN: This visit was conducted via Telephone to spare the patient unnecessary potential exposure in the healthcare setting during the current COVID-19 pandemic. 1. 59 y.o. gentleman with Stage T1c adenocarcinoma of the prostate with Gleason Score of 4+4, and PSA of 10.1. We discussed the patient's workup and outlined the nature of prostate cancer in this setting. The patient's T stage, Gleason's score, and PSA put him  into the high risk group. Accordingly, he is eligible for a variety of potential treatment options including LT-ADT (2 years) in combination with 8 weeks of external radiation, 5 weeks of external radiation preceded by a brachytherapy boost, or prostatectomy. We discussed the available radiation techniques, and focused on the details and logistics and delivery. We discussed and outlined the risks, benefits, short and long-term effects associated with radiotherapy and compared and contrasted these with prostatectomy. We discussed the role of SpaceOAR in reducing the rectal toxicity associated with radiotherapy. We also detailed the role of ADT in the treatment of high risk prostate cancer and outlined the associated side effects that could be expected with this therapy. He appears to have a good understanding of his disease and our treatment recommendations which are of curative intent.  He was encouraged to ask questions that were answered to his stated satisfaction.  At the end of the conversation the patient is interested in moving forward with LT-ADT with Dr. Alyson Ingles and 8 weeks of IMRT at [facility] with  Dr. Adella Nissen.  I'll contact Dr. Adella Nissen to coordinate, ADT in December 2021 for anticipated IMRT to start in February 2022.   Given current concerns for patient exposure during the COVID-19 pandemic, this encounter was conducted via telephone. The patient was notified in advance and was offered a MyChart meeting to allow for face to face communication but unfortunately reported that he did not have the appropriate resources/technology to support such a visit and instead preferred to proceed with telephone consult. The patient has given verbal consent for this type of encounter. The time spent during this encounter was 60 minutes. The attendants for this meeting include Tyler Pita MD and patient CANYON LOHR  During the encounter, Tyler Pita MD was located at [facility] Radiation Oncology Department.  Patient Derrick Murray  was located at home.   ------------------------------------------------   Tyler Pita, MD [place] Director and Director of Stereotactic Radiosurgery Direct Dial: [phone]  Fax: [phone] [email or web address]  Skype  [email or web address]   This document serves as a record of services personally performed by Tyler Pita, MD. It was created on his behalf by Wilburn Mylar, a trained medical scribe. The creation of this record is based on the scribe's personal observations and the provider's statements to them. This document has been checked and approved by the attending provider.

## 2020-11-17 ENCOUNTER — Telehealth: Payer: Self-pay

## 2020-11-17 NOTE — Telephone Encounter (Signed)
-----   Message from Cleon Gustin, MD sent at 11/15/2020  9:09 AM EST ----- normal ----- Message ----- From: Valentina Lucks, LPN Sent: 85/08/930   8:39 AM EST To: Cleon Gustin, MD  Pls review.

## 2020-11-17 NOTE — Telephone Encounter (Signed)
Pt called back and the pt notified procedure result normal.

## 2020-11-17 NOTE — Progress Notes (Signed)
Letter mailed

## 2020-11-24 ENCOUNTER — Other Ambulatory Visit: Payer: Self-pay

## 2020-11-24 ENCOUNTER — Encounter: Payer: Self-pay | Admitting: Urology

## 2020-11-24 ENCOUNTER — Ambulatory Visit (INDEPENDENT_AMBULATORY_CARE_PROVIDER_SITE_OTHER): Payer: BC Managed Care – PPO | Admitting: Urology

## 2020-11-24 VITALS — BP 123/73 | HR 50 | Temp 98.7°F | Ht 73.0 in | Wt 258.0 lb

## 2020-11-24 DIAGNOSIS — C61 Malignant neoplasm of prostate: Secondary | ICD-10-CM

## 2020-11-24 LAB — URINALYSIS, ROUTINE W REFLEX MICROSCOPIC
Bilirubin, UA: NEGATIVE
Ketones, UA: NEGATIVE
Leukocytes,UA: NEGATIVE
Nitrite, UA: NEGATIVE
Protein,UA: NEGATIVE
RBC, UA: NEGATIVE
Specific Gravity, UA: <1.005 — ABNORMAL LOW (ref 1.005–1.030)
Urobilinogen, Ur: 0.2 mg/dL (ref 0.2–1.0)
pH, UA: 5 (ref 5.0–7.5)

## 2020-11-24 MED ORDER — DEGARELIX ACETATE(240 MG DOSE) 120 MG/VIAL ~~LOC~~ SOLR
240.0000 mg | Freq: Once | SUBCUTANEOUS | Status: AC
Start: 2020-11-24 — End: 2020-11-24
  Administered 2020-11-24: 240 mg via SUBCUTANEOUS

## 2020-11-24 NOTE — Progress Notes (Signed)
Urological Symptom Review  Patient is experiencing the following symptoms: Frequent urination Get up at night to urinate Have to strain to urinate Erection problems (male only)   Review of Systems  Gastrointestinal (upper)  : Negative for upper GI symptoms  Gastrointestinal (lower) : Negative for lower GI symptoms  Constitutional : Negative for symptoms  Skin: Negative for skin symptoms  Eyes: Blurred vision  Ear/Nose/Throat : Negative for Ear/Nose/Throat symptoms  Hematologic/Lymphatic: Negative for Hematologic/Lymphatic symptoms  Cardiovascular : Negative for cardiovascular symptoms  Respiratory : Negative for respiratory symptoms  Endocrine: Negative for endocrine symptoms  Musculoskeletal: Back pain Joint pain  Neurological: Negative for neurological symptoms  Psychologic: Depression Anxiety

## 2020-11-24 NOTE — Patient Instructions (Signed)
Prostate Cancer  The prostate is a male gland that helps make semen. Prostate cancer is when abnormal cells grow in this gland. Follow these instructions at home:  Take over-the-counter and prescription medicines only as told by your doctor.  Eat a healthy diet.  Get plenty of sleep.  Ask your doctor for help to find a support group for men with prostate cancer.  Keep all follow-up visits as told by your doctor. This is important.  If you have to go to the hospital, let your cancer doctor (oncologist) know.  Touch, hold, hug, and caress your partner to continue to show sexual feelings. Contact a doctor if:  You have trouble peeing (urinating).  You have blood in your pee (urine).  You have pain in your hips, back, or chest. Get help right away if:  You have weakness in your legs.  You lose feeling (have numbness) in your legs.  You cannot control your pee or your poop (stool).  You have trouble breathing.  You have sudden pain in your chest.  You have chills or a fever. Summary  The prostate is a male gland that helps make semen. Prostate cancer is when abnormal cells grow in this gland.  Ask your doctor for help to find a support group for men with prostate cancer.  Contact a doctor if you have problems peeing or have any new pain that you did not have before. This information is not intended to replace advice given to you by your health care provider. Make sure you discuss any questions you have with your health care provider. Document Revised: 11/02/2017 Document Reviewed: 07/31/2016 Elsevier Patient Education  2020 [employer]  

## 2020-11-24 NOTE — Progress Notes (Signed)
11/24/2020 11:28 AM   Derrick Murray 10/04/1961 [id number]  Referring provider: [address], [place] [address]  followup prostate cancer  HPI: Derrick Murray is a 59yo here for followup for prostate cancer. He met with radiation oncology and has elected to proceed with IMRT and long term ADT. No significant LUTS. He has mild ED. He is due to start ADT today   PMH: Past Medical History:  Diagnosis Date  . Anxiety   . Diabetes mellitus without complication ([place])   . Hypertension   . Prostate cancer [facility])     Surgical History: Past Surgical History:  Procedure Laterality Date  . HERNIA REPAIR    . PROSTATE BIOPSY    . TENDON REPAIR Left    left arm    Home Medications:  Allergies as of 11/24/2020      Reactions   Penicillins Swelling, Rash      Medication List       Accurate as of November 24, 2020 11:28 AM. If you have any questions, ask your nurse or doctor.        amLODipine-benazepril 10-20 MG capsule Commonly known as: LOTREL Take 1 capsule by mouth daily.   atorvastatin 40 MG tablet Commonly known as: LIPITOR Take 40 mg by mouth daily.   Bayer Aspirin EC Low Dose 81 MG EC tablet Generic drug: aspirin Take 81 mg by mouth daily.   divalproex 250 MG 24 hr tablet Commonly known as: DEPAKOTE ER   FLUoxetine 40 MG capsule Commonly known as: PROZAC Take 40 mg by mouth daily.   glipiZIDE 5 MG tablet Commonly known as: GLUCOTROL Take 5 mg by mouth daily.   Jardiance 25 MG Tabs tablet Generic drug: empagliflozin Take 25 mg by mouth daily.   metFORMIN 1000 MG tablet Commonly known as: GLUCOPHAGE Take 1,000 mg by mouth 2 (two) times daily.   omeprazole 40 MG capsule Commonly known as: PRILOSEC Take 40 mg by mouth daily.   omeprazole-sodium bicarbonate 40-1100 MG capsule Commonly known as: ZEGERID Take by mouth.   sitaGLIPtin 100 MG tablet Commonly known as: JANUVIA Januvia 100 mg tablet        Allergies:  Allergies  Allergen Reactions  . Penicillins Swelling and Rash    Family History: Family History  Problem Relation Age of Onset  . Colon cancer Maternal Grandfather   . Heart attack Maternal Grandfather   . Breast cancer Neg Hx   . Prostate cancer Neg Hx   . Pancreatic cancer Neg Hx     Social History:  reports that he has never smoked. He quit smokeless tobacco use about 30 years ago.  His smokeless tobacco use included chew. He reports current alcohol use. He reports that he does not use drugs.  ROS: All other review of systems were reviewed and are negative except what is noted above in HPI  Physical Exam: BP 123/73   Pulse (!) 50   Temp 98.7 F (37.1 C)   Ht _0  (1.854 m)   Wt 258 lb (117 kg)   BMI 34.04 kg/m   Constitutional:  Alert and oriented, No acute distress. HEENT: [place] AT, moist mucus membranes.  Trachea midline, no masses. Cardiovascular: No clubbing, cyanosis, or edema. Respiratory: Normal respiratory effort, no increased work of breathing. GI: Abdomen is soft, nontender, nondistended, no abdominal masses GU: No CVA tenderness.  Lymph: No cervical or inguinal lymphadenopathy. Skin: No rashes, bruises or suspicious lesions. Neurologic: Grossly intact, no  focal deficits, moving all 4 extremities. Psychiatric: Normal mood and affect.  Laboratory Data: No results found for: WBC, HGB, HCT, MCV, PLT  Lab Results  Component Value Date   CREATININE 1.21 10/21/2020    No results found for: PSA  No results found for: TESTOSTERONE  No results found for: HGBA1C  Urinalysis    Component Value Date/Time   COLORURINE YELLOW 07/15/2007 0841   APPEARANCEUR Clear 11/24/2020 1108   LABSPEC <1.005 (L) 07/15/2007 0841   PHURINE 6.0 07/15/2007 0841   GLUCOSEU 3+ (A) 11/24/2020 1108   HGBUR NEGATIVE 07/15/2007 0841   BILIRUBINUR Negative 11/24/2020 1108   [place] 07/15/2007 0841   PROTEINUR Negative 11/24/2020 1108   PROTEINUR  NEGATIVE 07/15/2007 0841   UROBILINOGEN 0.2 07/15/2007 0841   NITRITE Negative 11/24/2020 1108   NITRITE NEGATIVE 07/15/2007 0841   LEUKOCYTESUR Negative 11/24/2020 1108    Lab Results  Component Value Date   LABMICR Comment 11/24/2020    Pertinent Imaging:  No results found for this or any previous visit.  No results found for this or any previous visit.  No results found for this or any previous visit.  No results found for this or any previous visit.  No results found for this or any previous visit.  No results found for this or any previous visit.  No results found for this or any previous visit.  No results found for this or any previous visit.   Assessment & Plan:    1. Prostate cancer ([place]) - firmagon 246m today. He will return to clinic in 1 months with PSA and testosterone and for eligard 462m We disuucssed fiducial marker and SpaceOAR placement and the patient wishes to proceed with therapy. Risks/benefits/alternatives discusse - Urinalysis, Routine w reflex microscopic   Return in about 4 weeks (around 12/22/2020) for PSA and eligard 4534m PatNicolette BangD  [facility] [place]

## 2020-12-16 ENCOUNTER — Other Ambulatory Visit: Payer: Self-pay

## 2020-12-16 ENCOUNTER — Other Ambulatory Visit: Payer: BC Managed Care – PPO

## 2020-12-16 DIAGNOSIS — C61 Malignant neoplasm of prostate: Secondary | ICD-10-CM

## 2020-12-17 LAB — PSA: Prostate Specific Ag, Serum: 3.4 ng/mL (ref 0.0–4.0)

## 2020-12-17 LAB — TESTOSTERONE: Testosterone: 39 ng/dL — ABNORMAL LOW (ref 264–916)

## 2020-12-24 ENCOUNTER — Ambulatory Visit (INDEPENDENT_AMBULATORY_CARE_PROVIDER_SITE_OTHER): Payer: BC Managed Care – PPO | Admitting: Urology

## 2020-12-24 ENCOUNTER — Encounter: Payer: Self-pay | Admitting: Urology

## 2020-12-24 ENCOUNTER — Other Ambulatory Visit: Payer: Self-pay

## 2020-12-24 VITALS — BP 134/87 | HR 63 | Temp 98.0°F | Ht 73.0 in | Wt 257.0 lb

## 2020-12-24 DIAGNOSIS — C61 Malignant neoplasm of prostate: Secondary | ICD-10-CM

## 2020-12-24 DIAGNOSIS — R351 Nocturia: Secondary | ICD-10-CM | POA: Diagnosis not present

## 2020-12-24 LAB — URINALYSIS, ROUTINE W REFLEX MICROSCOPIC
Bilirubin, UA: NEGATIVE
Ketones, UA: NEGATIVE
Leukocytes,UA: NEGATIVE
Nitrite, UA: NEGATIVE
Protein,UA: NEGATIVE
RBC, UA: NEGATIVE
Specific Gravity, UA: <1.005 — ABNORMAL LOW (ref 1.005–1.030)
Urobilinogen, Ur: 0.2 mg/dL (ref 0.2–1.0)
pH, UA: 6 (ref 5.0–7.5)

## 2020-12-24 MED ORDER — LEUPROLIDE ACETATE (6 MONTH) 45 MG IM KIT
45.0000 mg | PACK | Freq: Once | INTRAMUSCULAR | Status: AC
  Administered 2020-12-24: 45 mg via INTRAMUSCULAR

## 2020-12-24 MED ORDER — ALFUZOSIN HCL ER 10 MG PO TB24: 10.0000 mg | ORAL_TABLET | Freq: Every day | ORAL | 11 refills | Status: DC

## 2020-12-24 NOTE — Patient Instructions (Signed)
Prostate Cancer  The prostate is a male gland that helps make semen. It is located below a man's bladder, in front of the rectum. Prostate cancer is when abnormal cells grow in this gland. What are the causes? The cause of this condition is not known. What increases the risk? You are more likely to develop this condition if:  You are 61 years of age or older.  You are African American.  You have a family history of prostate cancer.  You have a family history of breast cancer. What are the signs or symptoms? Symptoms of this condition include:  A need to pee often.  Peeing that is weak, or pee that stops and starts.  Trouble starting or stopping your pee.  Inability to pee.  Blood in your pee or semen.  Pain in the lower back, lower belly (abdomen), hips, or upper thighs.  Trouble getting an erection.  Trouble emptying all of your pee. How is this treated? Treatment for this condition depends on your age, your health, the kind of treatment you like, and how far the cancer has spread. Treatments include:  Being watched. This is called observation. You will be tested from time to time, but you will not get treated. Tests are to make sure that the cancer is not growing.  Surgery. This may be done to remove the prostate, to remove the testicles, or to freeze or kill cancer cells.  Radiation. This uses a strong beam to kill cancer cells.  Ultrasound energy. This uses strong sound waves to kill cancer cells.  Chemotherapy. This uses medicines that stop cancer cells from increasing. This kills cancer cells and healthy cells.  Targeted therapy. This kills cancer cells only. Healthy cells are not affected.  Hormone treatment. This stops the body from making hormones that help the cancer cells to grow. Follow these instructions at home:  Take over-the-counter and prescription medicines only as told by your doctor.  Eat a healthy diet.  Get plenty of sleep.  Ask your  doctor for help to find a support group for men with prostate cancer.  If you have to go to the hospital, let your cancer doctor (oncologist) know.  Treatment may affect your ability to have sex. Touch, hold, hug, and caress your partner to have intimate moments.  Keep all follow-up visits as told by your doctor. This is important. Contact a doctor if:  You have new or more trouble peeing.  You have new or more blood in your pee.  You have new or more pain in your hips, back, or chest. Get help right away if:  You have weakness in your legs.  You lose feeling in your legs.  You cannot control your pee or your poop (stool).  You have chills or a fever. Summary  The prostate is a male gland that helps make semen.  Prostate cancer is when abnormal cells grow in this gland.  Treatment includes doing surgery, using medicines, using very strong beams, or watching without treatment.  Ask your doctor for help to find a support group for men with prostate cancer.  Contact a doctor if you have problems peeing or have any new pain that you did not have before. This information is not intended to replace advice given to you by your health care provider. Make sure you discuss any questions you have with your health care provider. Document Revised: 11/04/2019 Document Reviewed: 11/04/2019 Elsevier Patient Education  2021 Elsevier Inc.  

## 2020-12-24 NOTE — H&P (View-Only) (Signed)
12/24/2020 11:24 AM   Derrick Murray January 25, 1961 [id number]  Referring provider: [address], [place] [address]  Followup prostate cancer  HPI: [name] is a 60yo here for followup for prostate cancer.PSA 3.4. Mild hot flashes. Fair energy. His only complaint is urinary frequency and nocturia 5-7x. [place]. He has urinary hesitancy, starting/stopping and dribbling. He is unhappy with his urination   PMH: Past Medical History:  Diagnosis Date  . Anxiety   . Diabetes mellitus without complication ([place])   . Hypertension   . Prostate cancer [facility])     Surgical History: Past Surgical History:  Procedure Laterality Date  . HERNIA REPAIR    . PROSTATE BIOPSY    . TENDON REPAIR Left    left arm    Home Medications:  Allergies as of 12/24/2020      Reactions   Penicillins Swelling, Rash      Medication List       Accurate as of December 24, 2020 11:24 AM. If you have any questions, ask your nurse or doctor.        amLODipine-benazepril 10-20 MG capsule Commonly known as: LOTREL Take 1 capsule by mouth daily.   atorvastatin 40 MG tablet Commonly known as: LIPITOR Take 40 mg by mouth daily.   Bayer Aspirin EC Low Dose 81 MG EC tablet Generic drug: aspirin Take 81 mg by mouth daily.   divalproex 250 MG 24 hr tablet Commonly known as: DEPAKOTE ER   EMERGEN-C VITAMIN C PO Take by mouth.   FLUoxetine 40 MG capsule Commonly known as: PROZAC Take 40 mg by mouth daily.   glipiZIDE 5 MG tablet Commonly known as: GLUCOTROL Take 5 mg by mouth daily.   Jardiance 25 MG Tabs tablet Generic drug: empagliflozin Take 25 mg by mouth daily.   metFORMIN 1000 MG tablet Commonly known as: GLUCOPHAGE Take 1,000 mg by mouth 2 (two) times daily.   omeprazole 40 MG capsule Commonly known as: PRILOSEC Take 40 mg by mouth daily.   omeprazole-sodium bicarbonate 40-1100 MG capsule Commonly known as: ZEGERID Take by mouth.    sitaGLIPtin 100 MG tablet Commonly known as: JANUVIA Januvia 100 mg tablet       Allergies:  Allergies  Allergen Reactions  . Penicillins Swelling and Rash    Family History: Family History  Problem Relation Age of Onset  . Colon cancer Maternal Grandfather   . Heart attack Maternal Grandfather   . Breast cancer Neg Hx   . Prostate cancer Neg Hx   . Pancreatic cancer Neg Hx     Social History:  reports that he has never smoked. He quit smokeless tobacco use about 31 years ago.  His smokeless tobacco use included chew. He reports current alcohol use. He reports that he does not use drugs.  ROS: All other review of systems were reviewed and are negative except what is noted above in HPI  Physical Exam: BP 134/87   Pulse 63   Temp 98 F (36.7 C)   Ht 6\' 1"  (1.854 m)   Wt 257 lb (116.6 kg)   BMI 33.91 kg/m   Constitutional:  Alert and oriented, No acute distress. HEENT: [place] AT, moist mucus membranes.  Trachea midline, no masses. Cardiovascular: No clubbing, cyanosis, or edema. Respiratory: Normal respiratory effort, no increased work of breathing. GI: Abdomen is soft, nontender, nondistended, no abdominal masses GU: No CVA tenderness.  Lymph: No cervical or inguinal lymphadenopathy. Skin: No rashes,  bruises or suspicious lesions. Neurologic: Grossly intact, no focal deficits, moving all 4 extremities. Psychiatric: Normal mood and affect.  Laboratory Data: No results found for: WBC, HGB, HCT, MCV, PLT  Lab Results  Component Value Date   CREATININE 1.21 10/21/2020    No results found for: PSA  Lab Results  Component Value Date   TESTOSTERONE 39 (L) 12/16/2020    No results found for: HGBA1C  Urinalysis    Component Value Date/Time   COLORURINE YELLOW 07/15/2007 0841   APPEARANCEUR Clear 11/24/2020 1108   LABSPEC <1.005 (L) 07/15/2007 0841   PHURINE 6.0 07/15/2007 0841   GLUCOSEU 3+ (A) 11/24/2020 1108   HGBUR NEGATIVE 07/15/2007 0841   BILIRUBINUR  Negative 11/24/2020 1108   [place] 07/15/2007 0841   PROTEINUR Negative 11/24/2020 1108   PROTEINUR NEGATIVE 07/15/2007 0841   UROBILINOGEN 0.2 07/15/2007 0841   NITRITE Negative 11/24/2020 1108   NITRITE NEGATIVE 07/15/2007 0841   LEUKOCYTESUR Negative 11/24/2020 1108    Lab Results  Component Value Date   LABMICR Comment 11/24/2020    Pertinent Imaging:  No results found for this or any previous visit.  No results found for this or any previous visit.  No results found for this or any previous visit.  No results found for this or any previous visit.  No results found for this or any previous visit.  No results found for this or any previous visit.  No results found for this or any previous visit.  No results found for this or any previous visit.   Assessment & Plan:   1. Prostate Cancer -Patient scheduled for fiducial markers and SpaceOAr on 2/7.  2. Nocturia/urinary frequency: -Uroxatral 10mg  qhs  No follow-ups on file.  Derrick Bang, MD  [facility] [place]

## 2020-12-24 NOTE — Progress Notes (Signed)
Urological Symptom Review  Patient is experiencing the following symptoms: Frequent urination Getting up at night to urinate Erection problems  Review of Systems  Gastrointestinal (upper)  : Negative for upper GI symptoms  Gastrointestinal (lower) : Constipation  Constitutional : Night Sweats  Skin: Negative for skin symptoms  Eyes: Negative for eye symptoms  Ear/Nose/Throat : Negative for Ear/Nose/Throat symptoms  Hematologic/Lymphatic: Negative for Hematologic/Lymphatic symptoms  Cardiovascular : Negative for cardiovascular symptoms  Respiratory : Negative for respiratory symptoms  Endocrine: Negative for endocrine symptoms  Musculoskeletal: Back pain  Neurological: Negative for neurological symptoms  Psychologic: Negative for psychiatric symptoms

## 2020-12-24 NOTE — Progress Notes (Signed)
Lupron IM Injection   Due to Prostate Cancer patient is present today for a Lupron Injection.  Medication: Lupron 6 month Dose: 45 mg  Location: right upper outer buttocks Lot: [id number] Exp: 03/14/23  Patient tolerated well, no complications were noted  Performed by: Antionette Char, Kaizer Dissinger,LPN  Follow up:

## 2020-12-24 NOTE — Progress Notes (Signed)
12/24/2020 11:24 AM   Derrick Murray January 25, 1961 [id number]  Referring provider: [address], [place] [address]  Followup prostate cancer  HPI: [name] is a 60yo here for followup for prostate cancer.PSA 3.4. Mild hot flashes. Fair energy. His only complaint is urinary frequency and nocturia 5-7x. [place]. He has urinary hesitancy, starting/stopping and dribbling. He is unhappy with his urination   PMH: Past Medical History:  Diagnosis Date  . Anxiety   . Diabetes mellitus without complication ([place])   . Hypertension   . Prostate cancer [facility])     Surgical History: Past Surgical History:  Procedure Laterality Date  . HERNIA REPAIR    . PROSTATE BIOPSY    . TENDON REPAIR Left    left arm    Home Medications:  Allergies as of 12/24/2020      Reactions   Penicillins Swelling, Rash      Medication List       Accurate as of December 24, 2020 11:24 AM. If you have any questions, ask your nurse or doctor.        amLODipine-benazepril 10-20 MG capsule Commonly known as: LOTREL Take 1 capsule by mouth daily.   atorvastatin 40 MG tablet Commonly known as: LIPITOR Take 40 mg by mouth daily.   Bayer Aspirin EC Low Dose 81 MG EC tablet Generic drug: aspirin Take 81 mg by mouth daily.   divalproex 250 MG 24 hr tablet Commonly known as: DEPAKOTE ER   EMERGEN-C VITAMIN C PO Take by mouth.   FLUoxetine 40 MG capsule Commonly known as: PROZAC Take 40 mg by mouth daily.   glipiZIDE 5 MG tablet Commonly known as: GLUCOTROL Take 5 mg by mouth daily.   Jardiance 25 MG Tabs tablet Generic drug: empagliflozin Take 25 mg by mouth daily.   metFORMIN 1000 MG tablet Commonly known as: GLUCOPHAGE Take 1,000 mg by mouth 2 (two) times daily.   omeprazole 40 MG capsule Commonly known as: PRILOSEC Take 40 mg by mouth daily.   omeprazole-sodium bicarbonate 40-1100 MG capsule Commonly known as: ZEGERID Take by mouth.    sitaGLIPtin 100 MG tablet Commonly known as: JANUVIA Januvia 100 mg tablet       Allergies:  Allergies  Allergen Reactions  . Penicillins Swelling and Rash    Family History: Family History  Problem Relation Age of Onset  . Colon cancer Maternal Grandfather   . Heart attack Maternal Grandfather   . Breast cancer Neg Hx   . Prostate cancer Neg Hx   . Pancreatic cancer Neg Hx     Social History:  reports that he has never smoked. He quit smokeless tobacco use about 31 years ago.  His smokeless tobacco use included chew. He reports current alcohol use. He reports that he does not use drugs.  ROS: All other review of systems were reviewed and are negative except what is noted above in HPI  Physical Exam: BP 134/87   Pulse 63   Temp 98 F (36.7 C)   Ht 6\' 1"  (1.854 m)   Wt 257 lb (116.6 kg)   BMI 33.91 kg/m   Constitutional:  Alert and oriented, No acute distress. HEENT: [place] AT, moist mucus membranes.  Trachea midline, no masses. Cardiovascular: No clubbing, cyanosis, or edema. Respiratory: Normal respiratory effort, no increased work of breathing. GI: Abdomen is soft, nontender, nondistended, no abdominal masses GU: No CVA tenderness.  Lymph: No cervical or inguinal lymphadenopathy. Skin: No rashes,  bruises or suspicious lesions. Neurologic: Grossly intact, no focal deficits, moving all 4 extremities. Psychiatric: Normal mood and affect.  Laboratory Data: No results found for: WBC, HGB, HCT, MCV, PLT  Lab Results  Component Value Date   CREATININE 1.21 10/21/2020    No results found for: PSA  Lab Results  Component Value Date   TESTOSTERONE 39 (L) 12/16/2020    No results found for: HGBA1C  Urinalysis    Component Value Date/Time   COLORURINE YELLOW 07/15/2007 0841   APPEARANCEUR Clear 11/24/2020 1108   LABSPEC <1.005 (L) 07/15/2007 0841   PHURINE 6.0 07/15/2007 0841   GLUCOSEU 3+ (A) 11/24/2020 1108   HGBUR NEGATIVE 07/15/2007 0841   BILIRUBINUR  Negative 11/24/2020 1108   KETONESUR NEGATIVE 07/15/2007 0841   PROTEINUR Negative 11/24/2020 1108   PROTEINUR NEGATIVE 07/15/2007 0841   UROBILINOGEN 0.2 07/15/2007 0841   NITRITE Negative 11/24/2020 1108   NITRITE NEGATIVE 07/15/2007 0841   LEUKOCYTESUR Negative 11/24/2020 1108    Lab Results  Component Value Date   LABMICR Comment 11/24/2020    Pertinent Imaging:  No results found for this or any previous visit.  No results found for this or any previous visit.  No results found for this or any previous visit.  No results found for this or any previous visit.  No results found for this or any previous visit.  No results found for this or any previous visit.  No results found for this or any previous visit.  No results found for this or any previous visit.   Assessment & Plan:   1. Prostate Cancer -Patient scheduled for fiducial markers and SpaceOAr on 2/7.  2. Nocturia/urinary frequency: -Uroxatral 10mg qhs  No follow-ups on file.  Camden Knotek, MD  [facility] [place]  

## 2020-12-28 ENCOUNTER — Encounter: Payer: Self-pay | Admitting: Urology

## 2020-12-29 ENCOUNTER — Telehealth: Payer: Self-pay

## 2021-01-05 NOTE — Patient Instructions (Signed)
Derrick Murray  01/05/2021     @PREFPERIOPPHARMACY @   Your procedure is scheduled on  01/10/2021.   Report to [name] at  [place]  A.M.   Call this number if you have problems the morning of surgery:  [phone]   Remember:  Do not eat or drink after midnight.                        Take these medicines the morning of surgery with A SIP OF WATER           Uroxatrol, amlodipine, prozac, prilosec.     Do not wear jewelry, make-up or nail polish.  Do not wear lotions, powders, or perfumes, or deodorant.  Do not shave 48 hours prior to surgery.  Men may shave face and neck.  Do not bring valuables to the hospital.  [facility] is not responsible for any belongings or valuables.  Contacts, dentures or bridgework may not be worn into surgery.  Leave your suitcase in the car.  After surgery it may be brought to your room.  For patients admitted to the hospital, discharge time will be determined by your treatment team.  Patients discharged the day of surgery will not be allowed to drive home and must have someone with them for 24 hours.   Shower the night before and the morning of your procedure with CHG. DO NOT put CHG on you face, hair or genitals.    After your night shower, dry off with a clean towel, out on clean clothes tho sleep in and place clean sheets on your bed before you sleep.   After your morning shower, dry off with a clean towel, put on clean, comfortable clothes and brush your teeth before you come to the hospital.   Please read over the following fact sheets that you were given. Surgical Site Infection Prevention, Anesthesia Post-op Instructions and Care and Recovery After Surgery       Transrectal Ultrasound-Guided Prostate Gold Seed Placement, Care After This sheet gives you information about how to care for yourself after your procedure. Your health care provider may also give you more specific instructions. If you have problems or questions,  contact your health care provider. What can I expect after the procedure? After the procedure, it is common to have:  Light bleeding from the rectum.  Bruising or tenderness in the area behind the scrotum (perineum).  Small amounts of blood in your urine. This should only last for a couple of days.  Light brown or red semen. This may last for a couple of weeks. Follow these instructions at home: Medicines  Take over-the-counter and prescription medicines only as told by your health care provider.  If you were prescribed an antibiotic medicine, take it as told by your health care provider. Do not stop taking the antibiotic even if you start to feel better.   Activity  Do not drive for 24 hours if you were given a medicine to help you relax (sedative) during your procedure.  Do not drive or use heavy machinery while taking prescription pain medicine.  Return to your normal activities as told by your health care provider. Ask your health care provider what activities are safe for you.  Ask your health care provider when it is safe for you to resume sexual activity. General instructions  Do not take baths, swim, or use a hot tub until your health care  provider approves.  Drink enough water to keep your urine pale yellow.  Plan to have a responsible adult care for you for at least 24 hours after you leave the hospital or clinic. This is important.  Keep all follow-up visits as told by your health care provider. This is important. Managing pain, stiffness, and swelling  If directed, put ice on the affected area: ? Put ice in a plastic bag. ? Place a towel between your skin and the bag. ? Leave the ice on for 20 minutes, 2-3 times a day.  Try not to sit directly on the area behind the scrotum. A soft cushion can help with discomfort. Contact a health care provider if:  You have a fever or chills.  You have more blood in your urine instead of less.  You have blood in your  urine for more than 2-3 days after the procedure.  You have trouble urinating or having a bowel movement.  You have pain or burning when urinating.  You have nausea or you vomit. Get help right away if:  You have severe pain that does not get better with medicine.  Your urine is bright red.  You cannot urinate.  You have rectal bleeding that gets worse.  You have shortness of breath. Summary  After the procedure, you may have blood in your urine and light bleeding from the rectum.  Return to your normal activities as told by your health care provider. Ask your health care provider what activities are safe for you.  Take over-the-counter and prescription medicines only as told by your health care provider.  Contact your health care provider right away if your urine is bright red or you cannot urinate. This information is not intended to replace advice given to you by your health care provider. Make sure you discuss any questions you have with your health care provider. Document Revised: 08/06/2020 Document Reviewed: 08/06/2020 Elsevier Patient Education  2021 [place] Anesthesia, Adult, Care After This sheet gives you information about how to care for yourself after your procedure. Your health care provider may also give you more specific instructions. If you have problems or questions, contact your health care provider. What can I expect after the procedure? After the procedure, the following side effects are common:  Pain or discomfort at the IV site.  Nausea.  Vomiting.  Sore throat.  Trouble concentrating.  Feeling cold or chills.  Feeling weak or tired.  Sleepiness and fatigue.  Soreness and body aches. These side effects can affect parts of the body that were not involved in surgery. Follow these instructions at home: For the time period you were told by your health care provider:  Rest.  Do not participate in activities where you could fall  or become injured.  Do not drive or use machinery.  Do not drink alcohol.  Do not take sleeping pills or medicines that cause drowsiness.  Do not make important decisions or sign legal documents.  Do not take care of children on your own.   Eating and drinking  Follow any instructions from your health care provider about eating or drinking restrictions.  When you feel hungry, start by eating small amounts of foods that are soft and easy to digest (bland), such as toast. Gradually return to your regular diet.  Drink enough fluid to keep your urine pale yellow.  If you vomit, rehydrate by drinking water, juice, or clear broth. General instructions  If you have sleep apnea, surgery  and certain medicines can increase your risk for breathing problems. Follow instructions from your health care provider about wearing your sleep device: ? Anytime you are sleeping, including during daytime naps. ? While taking prescription pain medicines, sleeping medicines, or medicines that make you drowsy.  Have a responsible adult stay with you for the time you are told. It is important to have someone help care for you until you are awake and alert.  Return to your normal activities as told by your health care provider. Ask your health care provider what activities are safe for you.  Take over-the-counter and prescription medicines only as told by your health care provider.  If you smoke, do not smoke without supervision.  Keep all follow-up visits as told by your health care provider. This is important. Contact a health care provider if:  You have nausea or vomiting that does not get better with medicine.  You cannot eat or drink without vomiting.  You have pain that does not get better with medicine.  You are unable to pass urine.  You develop a skin rash.  You have a fever.  You have redness around your IV site that gets worse. Get help right away if:  You have difficulty  breathing.  You have chest pain.  You have blood in your urine or stool, or you vomit blood. Summary  After the procedure, it is common to have a sore throat or nausea. It is also common to feel tired.  Have a responsible adult stay with you for the time you are told. It is important to have someone help care for you until you are awake and alert.  When you feel hungry, start by eating small amounts of foods that are soft and easy to digest (bland), such as toast. Gradually return to your regular diet.  Drink enough fluid to keep your urine pale yellow.  Return to your normal activities as told by your health care provider. Ask your health care provider what activities are safe for you. This information is not intended to replace advice given to you by your health care provider. Make sure you discuss any questions you have with your health care provider. Document Revised: 08/05/2020 Document Reviewed: 03/04/2020 Elsevier Patient Education  2021 [employer].

## 2021-01-06 ENCOUNTER — Other Ambulatory Visit: Payer: Self-pay

## 2021-01-06 ENCOUNTER — Other Ambulatory Visit (HOSPITAL_COMMUNITY): Payer: Self-pay | Admitting: Urology

## 2021-01-06 ENCOUNTER — Encounter (HOSPITAL_BASED_OUTPATIENT_CLINIC_OR_DEPARTMENT_OTHER): Payer: Self-pay | Admitting: Urology

## 2021-01-06 ENCOUNTER — Telehealth: Payer: Self-pay

## 2021-01-06 DIAGNOSIS — C61 Malignant neoplasm of prostate: Secondary | ICD-10-CM

## 2021-01-06 NOTE — Telephone Encounter (Signed)
Patient notified of [facility] OR cancelling procedure at [facility] for next week.  New surgery date of 01/10/2021 at [facility] approved and placed on surgery board. Pt aware of location change of surgery site.  Dr. Alyson Ingles aware of new surgery location., I spoke with [facility] as well.   I spoke with Enid Derry, Dr. Tammi Klippel scheduler, who will have Dr. Tammi Klippel there as well to operate u/s machine for Dr. Alyson Ingles.

## 2021-01-06 NOTE — Progress Notes (Signed)
Spoke w/ via phone for pre-op interview---pt Lab needs dos----  I stat 8 ekg             Lab results------none COVID test ------01-06-2021 230 pm Arrive at -------920 am 01-10-2021 NPO after MN NO Solid Food.  Clear liquids from MN until---820 am then npo Medications to take morning of surgery -----fluoxetine, atorvastatin,m alfuzosin, omeprazole Diabetic medication -----none day of surgery Patient Special Instructions -----none Pre-Op special Istructions -----none Patient verbalized understanding of instructions that were given at this phone interview. Patient denies shortness of breath, chest pain, fever, cough at this phone interview.

## 2021-01-07 ENCOUNTER — Encounter (HOSPITAL_COMMUNITY)
Admission: RE | Admit: 2021-01-07 | Discharge: 2021-01-07 | Disposition: A | Discharge status: Home / Self Care | Payer: BC Managed Care – PPO | Source: Ambulatory Visit | Attending: Urology | Admitting: Urology

## 2021-01-07 ENCOUNTER — Other Ambulatory Visit (HOSPITAL_COMMUNITY): Admission: RE | Admit: 2021-01-07 | Payer: BC Managed Care – PPO | Source: Ambulatory Visit

## 2021-01-07 ENCOUNTER — Encounter (HOSPITAL_COMMUNITY): Payer: Self-pay

## 2021-01-07 ENCOUNTER — Other Ambulatory Visit (HOSPITAL_COMMUNITY): Payer: BC Managed Care – PPO

## 2021-01-08 ENCOUNTER — Other Ambulatory Visit (HOSPITAL_COMMUNITY)
Admission: RE | Admit: 2021-01-08 | Discharge: 2021-01-08 | Disposition: A | Discharge status: Home / Self Care | Payer: BC Managed Care – PPO | Source: Ambulatory Visit | Attending: Urology | Admitting: Urology

## 2021-01-08 DIAGNOSIS — Z20822 Contact with and (suspected) exposure to covid-19: Secondary | ICD-10-CM | POA: Insufficient documentation

## 2021-01-08 DIAGNOSIS — Z7982 Long term (current) use of aspirin: Secondary | ICD-10-CM | POA: Diagnosis not present

## 2021-01-08 DIAGNOSIS — Z7984 Long term (current) use of oral hypoglycemic drugs: Secondary | ICD-10-CM | POA: Diagnosis not present

## 2021-01-08 DIAGNOSIS — C61 Malignant neoplasm of prostate: Secondary | ICD-10-CM | POA: Diagnosis present

## 2021-01-08 DIAGNOSIS — Z79899 Other long term (current) drug therapy: Secondary | ICD-10-CM | POA: Diagnosis not present

## 2021-01-08 DIAGNOSIS — Z880 Allergy status to penicillin: Secondary | ICD-10-CM | POA: Diagnosis not present

## 2021-01-08 DIAGNOSIS — Z01812 Encounter for preprocedural laboratory examination: Secondary | ICD-10-CM | POA: Insufficient documentation

## 2021-01-08 DIAGNOSIS — Z87891 Personal history of nicotine dependence: Secondary | ICD-10-CM | POA: Diagnosis not present

## 2021-01-08 LAB — SARS CORONAVIRUS 2 (TAT 6-24 HRS): SARS Coronavirus 2: NEGATIVE

## 2021-01-10 ENCOUNTER — Ambulatory Visit (HOSPITAL_COMMUNITY): Admission: RE | Admit: 2021-01-10 | Payer: BC Managed Care – PPO | Source: Ambulatory Visit

## 2021-01-10 ENCOUNTER — Encounter (HOSPITAL_BASED_OUTPATIENT_CLINIC_OR_DEPARTMENT_OTHER): Payer: Self-pay | Admitting: Urology

## 2021-01-10 ENCOUNTER — Ambulatory Visit (HOSPITAL_BASED_OUTPATIENT_CLINIC_OR_DEPARTMENT_OTHER)
Admission: RE | Admit: 2021-01-10 | Discharge: 2021-01-10 | Disposition: A | Discharge status: Home / Self Care | Payer: BC Managed Care – PPO | Attending: Urology | Admitting: Urology

## 2021-01-10 ENCOUNTER — Ambulatory Visit (HOSPITAL_BASED_OUTPATIENT_CLINIC_OR_DEPARTMENT_OTHER): Payer: BC Managed Care – PPO | Admitting: Anesthesiology

## 2021-01-10 ENCOUNTER — Encounter (HOSPITAL_BASED_OUTPATIENT_CLINIC_OR_DEPARTMENT_OTHER)
Admission: RE | Disposition: A | Discharge status: Home / Self Care | Payer: Self-pay | Source: Home / Self Care | Attending: Urology

## 2021-01-10 ENCOUNTER — Other Ambulatory Visit: Payer: Self-pay

## 2021-01-10 DIAGNOSIS — Z87891 Personal history of nicotine dependence: Secondary | ICD-10-CM | POA: Insufficient documentation

## 2021-01-10 DIAGNOSIS — Z20822 Contact with and (suspected) exposure to covid-19: Secondary | ICD-10-CM | POA: Insufficient documentation

## 2021-01-10 DIAGNOSIS — Z7982 Long term (current) use of aspirin: Secondary | ICD-10-CM | POA: Diagnosis not present

## 2021-01-10 DIAGNOSIS — Z880 Allergy status to penicillin: Secondary | ICD-10-CM | POA: Diagnosis not present

## 2021-01-10 DIAGNOSIS — C61 Malignant neoplasm of prostate: Secondary | ICD-10-CM

## 2021-01-10 DIAGNOSIS — Z7984 Long term (current) use of oral hypoglycemic drugs: Secondary | ICD-10-CM | POA: Insufficient documentation

## 2021-01-10 DIAGNOSIS — Z79899 Other long term (current) drug therapy: Secondary | ICD-10-CM | POA: Diagnosis not present

## 2021-01-10 HISTORY — DX: Presence of spectacles and contact lenses: Z97.3

## 2021-01-10 HISTORY — DX: Hyperlipidemia, unspecified: E78.5

## 2021-01-10 HISTORY — DX: Unspecified osteoarthritis, unspecified site: M19.90

## 2021-01-10 HISTORY — DX: Gastro-esophageal reflux disease without esophagitis: K21.9

## 2021-01-10 HISTORY — PX: SPACE OAR INSTILLATION: SHX6769

## 2021-01-10 HISTORY — PX: GOLD SEED IMPLANT: SHX6343

## 2021-01-10 HISTORY — DX: Presence of dental prosthetic device (complete) (partial): Z97.2

## 2021-01-10 LAB — POCT I-STAT, CHEM 8
BUN: 34 mg/dL — ABNORMAL HIGH (ref 6–20)
Calcium, Ion: 0.91 mmol/L — ABNORMAL LOW (ref 1.15–1.40)
Chloride: 104 mmol/L (ref 98–111)
Creatinine, Ser: 1 mg/dL (ref 0.61–1.24)
Glucose, Bld: 165 mg/dL — ABNORMAL HIGH (ref 70–99)
HCT: 48 % (ref 39.0–52.0)
Hemoglobin: 16.3 g/dL (ref 13.0–17.0)
Potassium: 5.1 mmol/L (ref 3.5–5.1)
Sodium: 139 mmol/L (ref 135–145)
TCO2: 23 mmol/L (ref 22–32)

## 2021-01-10 LAB — GLUCOSE, CAPILLARY: Glucose-Capillary: 151 mg/dL — ABNORMAL HIGH (ref 70–99)

## 2021-01-10 SURGERY — INJECTION, HYDROGEL SPACER
Anesthesia: General | Site: Prostate

## 2021-01-10 MED ORDER — FENTANYL CITRATE (PF) 100 MCG/2ML IJ SOLN
INTRAMUSCULAR | Status: AC
  Filled 2021-01-10: qty 2

## 2021-01-10 MED ORDER — EPHEDRINE 5 MG/ML INJ
INTRAVENOUS | Status: AC
  Filled 2021-01-10: qty 10

## 2021-01-10 MED ORDER — KETOROLAC TROMETHAMINE 30 MG/ML IJ SOLN
INTRAMUSCULAR | Status: DC | PRN
  Administered 2021-01-10: 15 mg via INTRAVENOUS

## 2021-01-10 MED ORDER — EPHEDRINE SULFATE-NACL 50-0.9 MG/10ML-% IV SOSY
PREFILLED_SYRINGE | INTRAVENOUS | Status: DC | PRN
  Administered 2021-01-10: 15 mg via INTRAVENOUS

## 2021-01-10 MED ORDER — OXYCODONE HCL 5 MG/5ML PO SOLN: 5.0000 mg | Freq: Once | ORAL | Status: DC | PRN

## 2021-01-10 MED ORDER — LACTATED RINGERS IV SOLN: INTRAVENOUS | Status: DC

## 2021-01-10 MED ORDER — PHENYLEPHRINE 40 MCG/ML (10ML) SYRINGE FOR IV PUSH (FOR BLOOD PRESSURE SUPPORT)
PREFILLED_SYRINGE | INTRAVENOUS | Status: DC | PRN
  Administered 2021-01-10: 120 ug via INTRAVENOUS

## 2021-01-10 MED ORDER — KETOROLAC TROMETHAMINE 30 MG/ML IJ SOLN
INTRAMUSCULAR | Status: AC
  Filled 2021-01-10: qty 1

## 2021-01-10 MED ORDER — DEXAMETHASONE SODIUM PHOSPHATE 10 MG/ML IJ SOLN
INTRAMUSCULAR | Status: AC
  Filled 2021-01-10: qty 1

## 2021-01-10 MED ORDER — PROPOFOL 10 MG/ML IV BOLUS
INTRAVENOUS | Status: AC
  Filled 2021-01-10: qty 20

## 2021-01-10 MED ORDER — PHENYLEPHRINE 40 MCG/ML (10ML) SYRINGE FOR IV PUSH (FOR BLOOD PRESSURE SUPPORT)
PREFILLED_SYRINGE | INTRAVENOUS | Status: AC
  Filled 2021-01-10: qty 10

## 2021-01-10 MED ORDER — PROPOFOL 10 MG/ML IV BOLUS
INTRAVENOUS | Status: DC | PRN
  Administered 2021-01-10: 200 mg via INTRAVENOUS

## 2021-01-10 MED ORDER — GENTAMICIN SULFATE 40 MG/ML IJ SOLN
5.0000 mg/kg | INTRAVENOUS | Status: AC
  Administered 2021-01-10: 470 mg via INTRAVENOUS
  Filled 2021-01-10: qty 11.75

## 2021-01-10 MED ORDER — OXYCODONE HCL 5 MG PO TABS: 5.0000 mg | ORAL_TABLET | Freq: Once | ORAL | Status: DC | PRN

## 2021-01-10 MED ORDER — MIDAZOLAM HCL 2 MG/2ML IJ SOLN
INTRAMUSCULAR | Status: DC | PRN
  Administered 2021-01-10: 2 mg via INTRAVENOUS

## 2021-01-10 MED ORDER — ACETAMINOPHEN 160 MG/5ML PO SOLN: 325.0000 mg | ORAL | Status: DC | PRN

## 2021-01-10 MED ORDER — SODIUM CHLORIDE 0.9 % IR SOLN
Status: DC | PRN
  Administered 2021-01-10: 10 mL

## 2021-01-10 MED ORDER — ONDANSETRON HCL 4 MG/2ML IJ SOLN: 4.0000 mg | Freq: Once | INTRAMUSCULAR | Status: DC | PRN

## 2021-01-10 MED ORDER — FENTANYL CITRATE (PF) 100 MCG/2ML IJ SOLN: 25.0000 ug | INTRAMUSCULAR | Status: DC | PRN

## 2021-01-10 MED ORDER — ONDANSETRON HCL 4 MG/2ML IJ SOLN
INTRAMUSCULAR | Status: DC | PRN
  Administered 2021-01-10: 4 mg via INTRAVENOUS

## 2021-01-10 MED ORDER — MEPERIDINE HCL 25 MG/ML IJ SOLN: 6.2500 mg | INTRAMUSCULAR | Status: DC | PRN

## 2021-01-10 MED ORDER — DEXAMETHASONE SODIUM PHOSPHATE 10 MG/ML IJ SOLN
INTRAMUSCULAR | Status: DC | PRN
  Administered 2021-01-10: 10 mg via INTRAVENOUS

## 2021-01-10 MED ORDER — MIDAZOLAM HCL 2 MG/2ML IJ SOLN
INTRAMUSCULAR | Status: AC
  Filled 2021-01-10: qty 2

## 2021-01-10 MED ORDER — LIDOCAINE 2% (20 MG/ML) 5 ML SYRINGE
INTRAMUSCULAR | Status: DC | PRN
  Administered 2021-01-10: 100 mg via INTRAVENOUS

## 2021-01-10 MED ORDER — LIDOCAINE HCL (PF) 2 % IJ SOLN
INTRAMUSCULAR | Status: AC
  Filled 2021-01-10: qty 5

## 2021-01-10 MED ORDER — FENTANYL CITRATE (PF) 100 MCG/2ML IJ SOLN
INTRAMUSCULAR | Status: DC | PRN
  Administered 2021-01-10 (×2): 50 ug via INTRAVENOUS

## 2021-01-10 MED ORDER — ONDANSETRON HCL 4 MG/2ML IJ SOLN
INTRAMUSCULAR | Status: AC
  Filled 2021-01-10: qty 2

## 2021-01-10 MED ORDER — ACETAMINOPHEN 325 MG PO TABS: 325.0000 mg | ORAL_TABLET | ORAL | Status: DC | PRN

## 2021-01-10 SURGICAL SUPPLY — 15 items
DRSG TEGADERM 4X4.75 (GAUZE/BANDAGES/DRESSINGS) ×3 IMPLANT
DRSG TEGADERM 8X12 (GAUZE/BANDAGES/DRESSINGS) ×3 IMPLANT
GAUZE SPONGE 4X4 12PLY STRL LF (GAUZE/BANDAGES/DRESSINGS) ×1 IMPLANT
GLOVE ECLIPSE 8.0 STRL XLNG CF (GLOVE) ×2 IMPLANT
GLOVE SURG ENC MOIS LTX SZ8 (GLOVE) ×2 IMPLANT
GLOVE SURG ORTHO LTX SZ8.5 (GLOVE) ×2 IMPLANT
GOWN STRL REUS W/TWL XL LVL3 (GOWN DISPOSABLE) ×2 IMPLANT
IMPL SPACEOAR VUE SYSTEM (Spacer) IMPLANT
IMPLANT SPACEOAR VUE SYSTEM (Spacer) ×2 IMPLANT
KIT TURNOVER CYSTO (KITS) ×2 IMPLANT
MARKER GOLD PRELOAD 1.2X3 (Urological Implant) ×1 IMPLANT
PACK CYSTO (CUSTOM PROCEDURE TRAY) ×2 IMPLANT
SEED GOLD PRELOAD 1.2X3 (Urological Implant) ×2 IMPLANT
SURGILUBE 2OZ TUBE FLIPTOP (MISCELLANEOUS) ×2 IMPLANT
UNDERPAD 30X36 HEAVY ABSORB (UNDERPADS AND DIAPERS) ×4 IMPLANT

## 2021-01-10 NOTE — Anesthesia Preprocedure Evaluation (Addendum)
Anesthesia Evaluation  Patient identified by MRN, date of birth, ID band Patient awake    Reviewed: Allergy & Precautions, H&P , NPO status , Patient's Chart, lab work & pertinent test results, reviewed documented beta blocker date and time   Airway Mallampati: II  TM Distance: >3 FB Neck ROM: full    Dental no notable dental hx. (+) Teeth Intact, Missing, Partial Lower,    Pulmonary neg pulmonary ROS,    Pulmonary exam normal breath sounds clear to auscultation       Cardiovascular Exercise Tolerance: Good hypertension, Pt. on medications  Rhythm:regular Rate:Normal     Neuro/Psych PSYCHIATRIC DISORDERS Anxiety  Neuromuscular disease    GI/Hepatic Neg liver ROS, GERD  Medicated,  Endo/Other  negative endocrine ROSdiabetes, Type 2  Renal/GU negative Renal ROS  negative genitourinary   Musculoskeletal  (+) Arthritis , Osteoarthritis,    Abdominal   Peds  Hematology negative hematology ROS (+)   Anesthesia Other Findings   Reproductive/Obstetrics negative OB ROS                            Anesthesia Physical Anesthesia Plan  ASA: III  Anesthesia Plan: General   Post-op Pain Management:    Induction: Intravenous  PONV Risk Score and Plan: 2 and Ondansetron and Dexamethasone  Airway Management Planned: LMA and Oral ETT  Additional Equipment:   Intra-op Plan:   Post-operative Plan: Extubation in OR  Informed Consent: I have reviewed the patients History and Physical, chart, labs and discussed the procedure including the risks, benefits and alternatives for the proposed anesthesia with the patient or authorized representative who has indicated his/her understanding and acceptance.     Dental Advisory Given  Plan Discussed with: CRNA and Anesthesiologist  Anesthesia Plan Comments: ( )        Anesthesia Quick Evaluation

## 2021-01-10 NOTE — Transfer of Care (Signed)
Immediate Anesthesia Transfer of Care Note  Patient: Derrick Murray  Procedure(s) Performed: Procedure(s) (LRB): SPACE OAR INSTILLATION (N/A) GOLD SEED IMPLANT (N/A)  Patient Location: PACU  Anesthesia Type: General  Level of Consciousness: awake, alert  and oriented  Airway & Oxygen Therapy: Patient Spontanous Breathing and Patient connected to nasal cannula oxygen  Post-op Assessment: Report given to PACU RN and Post -op Vital signs reviewed and stable  Post vital signs: Reviewed and stable  Complications: No apparent anesthesia complications Last Vitals:  Vitals Value Taken Time  BP 138/84 01/10/21 1156  Temp    Pulse 81 01/10/21 1159  Resp 14 01/10/21 1159  SpO2 98 % 01/10/21 1159  Vitals shown include unvalidated device data.  Last Pain:  Vitals:   01/10/21 0915  TempSrc: Oral  PainSc: 0-No pain      Patients Stated Pain Goal: 3 (02/21/21 4825)  Complications: No complications documented.

## 2021-01-10 NOTE — Discharge Instructions (Signed)
Transrectal Ultrasound-Guided Prostate Gold Seed Placement Transrectal ultrasound-guided prostate gold seed placement is a procedure to place small metal seeds (fiducial markers)in or around a tumor on the prostate. These seeds help show exactly where a tumor is located. This helps guide radiation therapy directly at the prostate tumor, which avoids killing nearby healthy tissue. During this procedure, a small device (probe) is lubricated and placed inside the rectum. The probe makes sound waves that create a picture of the prostate (transrectal ultrasound). The images will be used to help guide the placement of the gold seeds. This procedure is also called fiducial marker placement. Tell a health care provider about:  Any allergies you have.  All medicines you are taking, including vitamins, herbs, eye drops, creams, and over-the-counter medicines.  Any problems you or family members have had with anesthetic medicines.  Any medical conditions you have.  Any blood disorders you have.  Any surgeries you have had. What are the risks? Generally, this is a safe procedure. However, problems may occur, including:  Infection.  Bleeding.  Allergic reactions to medicines or dyes.  Damage to nearby structures or organs.  The gold seeds moving to another part of the body. This is rare. What happens before the procedure? Medicines  Ask your health care provider about: ? Changing or stopping your regular medicines. This is especially important if you are taking diabetes medicines or blood thinners. ? Taking over-the-counter medicines, vitamins, herbs, and supplements. ? Taking medicines such as aspirin and ibuprofen. These medicines can thin your blood. Do not take these medicines before your procedure unless your health care provider instructs you to do so.  You may be given antibiotic medicine to help prevent an infection. Staying hydrated Follow instructions from your health care provider  about hydration, which may include:  Up to 2 hours before the procedure - you may continue to drink clear liquids, such as water, clear fruit juice, black coffee, and plain tea. Eating and drinking restrictions Follow instructions from your health care provider about eating and drinking, which may include:  8 hours before the procedure - stop eating heavy meals or foods such as meat, fried foods, or fatty foods.  6 hours before the procedure - stop eating light meals or foods, such as toast or cereal.  6 hours before the procedure - stop drinking milk or drinks that contain milk.  2 hours before the procedure - stop drinking clear liquids. General instructions  If you were asked to do a bowel prep before your procedure, follow instructions from your health care provider about how to do this.  You may have a blood sample taken.  Plan to have a responsible adult take you home from the hospital or clinic.  Plan to have a responsible adult care for you for at least 24 hours after you leave the hospital or clinic. This is important. What happens during the procedure?  To lower your risk of infection: ? Your health care team will wash or sanitize their hands. ? Hair may be removed from the surgical area. ? Your skin will be washed with soap.  Monitors may be placed on your body to track your heart rate, blood pressure, and breathing.  An IV will be inserted into one of your veins.  You will be given one or more of the following: ? A medicine to help you relax (sedative). ? A medicine to numb the area (local anesthetic). ? A medicine that is injected into an area of your  body to numb everything below the injection site (regional anesthetic).  A lubricated probe will be inserted into your rectum to perform the ultrasound.  You will be placed on your left side with your knees bent up toward your chest.  Using the ultrasound as a guide, the health care provider will insert a needle  between your rectum and scrotum and will place it near the tumor.  The needle will inject the gold seed into the area around the tumor. This will be repeated with two more gold seeds. More seeds may be added depending on the size and location of the tumor.  The needle and probe will be removed. The procedure may vary among health care providers and hospitals.   What happens after the procedure?  You may have imaging tests done to check the placement of the gold seeds. This may include a CT scan or ultrasound.  Your blood pressure, heart rate, breathing rate, and blood oxygen level will continue to be monitored until the medicines you were given have worn off.  Do not drive for 24 hours if you were given a sedative.  You will be given medicine to help with pain, if needed. Summary  This procedure involves placing small metal seeds (fiducial markers) in or around a prostate tumor.  The gold seeds help guide radiation therapy directly at the prostate tumor, which avoids killing nearby healthy tissue.  During this procedure, a small probe will be placed in the rectum to take images of the prostate (transrectal ultrasound). The images will help guide the placement of the gold seeds. This information is not intended to replace advice given to you by your health care provider. Make sure you discuss any questions you have with your health care provider. Document Revised: 08/06/2020 Document Reviewed: 08/06/2020 Elsevier Patient Education  2021 [place].     Transrectal Ultrasound-Guided Prostate Gold Seed Placement, Care After This sheet gives you information about how to care for yourself after your procedure. Your health care provider may also give you more specific instructions. If you have problems or questions, contact your health care provider. What can I expect after the procedure? After the procedure, it is common to have:  Light bleeding from the rectum.  Bruising or  tenderness in the area behind the scrotum (perineum).  Small amounts of blood in your urine. This should only last for a couple of days.  Light brown or red semen. This may last for a couple of weeks. Follow these instructions at home: Medicines  Take over-the-counter and prescription medicines only as told by your health care provider.  If you were prescribed an antibiotic medicine, take it as told by your health care provider. Do not stop taking the antibiotic even if you start to feel better.   Activity  Do not drive for 24 hours if you were given a medicine to help you relax (sedative) during your procedure.  Do not drive or use heavy machinery while taking prescription pain medicine.  Return to your normal activities as told by your health care provider. Ask your health care provider what activities are safe for you.  Ask your health care provider when it is safe for you to resume sexual activity. General instructions  Do not take baths, swim, or use a hot tub until your health care provider approves.  Drink enough water to keep your urine pale yellow.  Plan to have a responsible adult care for you for at least 24 hours after you  leave the hospital or clinic. This is important.  Keep all follow-up visits as told by your health care provider. This is important. Managing pain, stiffness, and swelling  If directed, put ice on the affected area: ? Put ice in a plastic bag. ? Place a towel between your skin and the bag. ? Leave the ice on for 20 minutes, 2-3 times a day.  Try not to sit directly on the area behind the scrotum. A soft cushion can help with discomfort. Contact a health care provider if:  You have a fever or chills.  You have more blood in your urine instead of less.  You have blood in your urine for more than 2-3 days after the procedure.  You have trouble urinating or having a bowel movement.  You have pain or burning when urinating.  You have nausea  or you vomit. Get help right away if:  You have severe pain that does not get better with medicine.  Your urine is bright red.  You cannot urinate.  You have rectal bleeding that gets worse.  You have shortness of breath. Summary  After the procedure, you may have blood in your urine and light bleeding from the rectum.  Return to your normal activities as told by your health care provider. Ask your health care provider what activities are safe for you.  Take over-the-counter and prescription medicines only as told by your health care provider.  Contact your health care provider right away if your urine is bright red or you cannot urinate. This information is not intended to replace advice given to you by your health care provider. Make sure you discuss any questions you have with your health care provider. Document Revised: 08/06/2020 Document Reviewed: 08/06/2020 Elsevier Patient Education  2021 [employer]   NO IBUPROFEN PRODUCTS (MOTRIN, ADVIL) OR ALEVE UNTIL 5:45PM TODAY.

## 2021-01-10 NOTE — Op Note (Signed)
PRE-OPERATIVE DIAGNOSIS:  Adenocarcinoma of the prostate  POST-OPERATIVE DIAGNOSIS:  Same  PROCEDURE: 1. Prostate Ultrasound 2. Placement of fiducial marks 3. Placement of SpaceOAR  SURGEON:  Surgeon(s): Nicolette Bang, MD  Radiation Oncologist: Tyler Pita, MD  ANESTHESIA:  General  EBL:  Minimal  DRAINS: none  INDICATION: [name] is a 60 year old with a history of T1c prostate cancer who is scheduled to undergo IMRT. He wishes to have fiducial markers and SpaceOAR placed prior to IMRT to decrease rectal toxicity.  Description of procedure: After informed consent the patient was brought to the major OR, placed on the table and administered general anesthesia. He was then moved to the modified lithotomy position with his perineum perpendicular to the floor. His perineum and genitalia were then sterilely prepped. An official timeout was then performed. The transrectal ultrasound probe was placed in the rectum and affixed to the stand. He was then sterilely draped.  A transrectal ultrasound of the prostate was performed.  Lidocaine  was not  instilled using ultrasound guidance into the junction of each seminal vesicle of the prostate.  3 Gold markers were placed into the prostate using the standard template and ultrasound guidance.  Accurate placement of the markers was confirmed.  We then proceeded to mix the SpaceOAR using the kit supplied from the manufacturer. Once this was complete we placed a sinal needle into the perirectal fat between the rectum and the prostate. Once this was accomplished we injected 2cc of normal saline to hydrodissect the plain. We then instilled the the SpaceOAR through the spinal needle and noted good distribution in the perirectal fat.   The patient was awakened and taken to recovery room in stable and satisfactory condition. He tolerated procedure well and there were no intraoperative complications.  CONDITION: Stable, extubated, transferred to  PACU  PLAN: The patient is to be discharged home and he will start IMRT in the next 2-3 weeks

## 2021-01-10 NOTE — Anesthesia Procedure Notes (Signed)
Procedure Name: LMA Insertion Date/Time: 01/10/2021 11:29 AM Performed by: Mechele Claude, CRNA Pre-anesthesia Checklist: Patient identified, Emergency Drugs available, Suction available and Patient being monitored Patient Re-evaluated:Patient Re-evaluated prior to induction Oxygen Delivery Method: Circle system utilized Preoxygenation: Pre-oxygenation with 100% oxygen Induction Type: IV induction Ventilation: Mask ventilation without difficulty LMA: LMA inserted LMA Size: 5.0 Number of attempts: 2 Airway Equipment and Method: Bite block Placement Confirmation: positive ETCO2 Tube secured with: Tape Dental Injury: Teeth and Oropharynx as per pre-operative assessment

## 2021-01-10 NOTE — Interval H&P Note (Signed)
History and Physical Interval Note:  01/10/2021 10:54 AM  Derrick Murray  has presented today for surgery, with the diagnosis of prostate cancer.  The various methods of treatment have been discussed with the patient and family. After consideration of risks, benefits and other options for treatment, the patient has consented to  Procedure(s): SPACE OAR INSTILLATION (N/A) GOLD SEED IMPLANT (N/A) as a surgical intervention.  The patient's history has been reviewed, patient examined, no change in status, stable for surgery.  I have reviewed the patient's chart and labs.  Questions were answered to the patient's satisfaction.     Nicolette Bang

## 2021-01-10 NOTE — Telephone Encounter (Signed)
Nothing further to do. It is related to the ADT

## 2021-01-10 NOTE — Anesthesia Postprocedure Evaluation (Signed)
Anesthesia Post Note  Patient: PRAVIN PEREZPEREZ  Procedure(s) Performed: SPACE OAR INSTILLATION (N/A Prostate) GOLD SEED IMPLANT (N/A Prostate)     Patient location during evaluation: PACU Anesthesia Type: General Level of consciousness: awake and alert Pain management: pain level controlled Vital Signs Assessment: post-procedure vital signs reviewed and stable Respiratory status: spontaneous breathing, nonlabored ventilation, respiratory function stable and patient connected to nasal cannula oxygen Cardiovascular status: blood pressure returned to baseline and stable Postop Assessment: no apparent nausea or vomiting Anesthetic complications: no   No complications documented.  Last Vitals:  Vitals:   01/10/21 1200 01/10/21 1215  BP: 125/87 132/77  Pulse: 78 76  Resp: 13 14  Temp:    SpO2: 98% 98%    Last Pain:  Vitals:   01/10/21 1215  TempSrc:   PainSc: 2                  Devonia Farro

## 2021-01-11 ENCOUNTER — Encounter (HOSPITAL_BASED_OUTPATIENT_CLINIC_OR_DEPARTMENT_OTHER): Payer: Self-pay | Admitting: Urology

## 2021-01-18 ENCOUNTER — Telehealth: Payer: Self-pay

## 2021-01-18 NOTE — Telephone Encounter (Signed)
I spoke with Pam at [facility] and confirmed radiation appts begin 01/24/2021

## 2021-02-07 ENCOUNTER — Other Ambulatory Visit (HOSPITAL_COMMUNITY): Payer: BC Managed Care – PPO

## 2021-02-08 ENCOUNTER — Other Ambulatory Visit: Payer: Self-pay

## 2021-02-08 ENCOUNTER — Ambulatory Visit (INDEPENDENT_AMBULATORY_CARE_PROVIDER_SITE_OTHER): Payer: BC Managed Care – PPO | Admitting: Urology

## 2021-02-08 ENCOUNTER — Encounter: Payer: Self-pay | Admitting: Urology

## 2021-02-08 VITALS — BP 100/67 | HR 137 | Temp 98.6°F | Ht 73.0 in | Wt 258.0 lb

## 2021-02-08 DIAGNOSIS — N401 Enlarged prostate with lower urinary tract symptoms: Secondary | ICD-10-CM | POA: Diagnosis not present

## 2021-02-08 DIAGNOSIS — R3912 Poor urinary stream: Secondary | ICD-10-CM | POA: Diagnosis not present

## 2021-02-08 DIAGNOSIS — R351 Nocturia: Secondary | ICD-10-CM

## 2021-02-08 DIAGNOSIS — C61 Malignant neoplasm of prostate: Secondary | ICD-10-CM | POA: Diagnosis not present

## 2021-02-08 DIAGNOSIS — N138 Other obstructive and reflux uropathy: Secondary | ICD-10-CM

## 2021-02-08 LAB — URINALYSIS, ROUTINE W REFLEX MICROSCOPIC
Bilirubin, UA: NEGATIVE
Ketones, UA: NEGATIVE
Leukocytes,UA: NEGATIVE
Nitrite, UA: NEGATIVE
Protein,UA: NEGATIVE
RBC, UA: NEGATIVE
Specific Gravity, UA: 1.01 (ref 1.005–1.030)
Urobilinogen, Ur: 0.2 mg/dL (ref 0.2–1.0)
pH, UA: 5.5 (ref 5.0–7.5)

## 2021-02-08 MED ORDER — ALFUZOSIN HCL ER 10 MG PO TB24: 10.0000 mg | ORAL_TABLET | Freq: Two times a day (BID) | ORAL | 11 refills | Status: DC

## 2021-02-08 NOTE — Patient Instructions (Signed)

## 2021-02-08 NOTE — Progress Notes (Signed)
Urological Symptom Review  Patient is experiencing the following symptoms: Frequent urination Erection problems (male only)   Review of Systems  Gastrointestinal (upper)  : Negative for upper GI symptoms  Gastrointestinal (lower) : Diarrhea  Constitutional : Negative for symptoms  Skin: Negative for skin symptoms  Eyes: Negative for eye symptoms  Ear/Nose/Throat : Negative for Ear/Nose/Throat symptoms  Hematologic/Lymphatic: Easy bruising  Cardiovascular : Negative for cardiovascular symptoms  Respiratory : Negative for respiratory symptoms  Endocrine: Negative for endocrine symptoms  Musculoskeletal: Back pain Joint pain  Neurological: Negative for neurological symptoms  Psychologic: Negative for psychiatric symptoms

## 2021-02-08 NOTE — Progress Notes (Signed)
02/08/2021 4:08 PM   Derrick Murray [birth year] [id number]  Referring provider: Kendrick Ranch, [place] [address]  BPH followup  HPI: [name] is a 60yo here for followup for BPH with nocturia and weak urinary stream. Since last visit he has developed a weaker stream, urinary frequency every 1-2 hours, nocturia 3-4x, post void dribbling. He increased his uroxatral to 10mg  BID which improved his LUTS.    PMH: Past Medical History:  Diagnosis Date  . Anxiety   . Arthritis   . Diabetes mellitus without complication ([place])    type2  . GERD (gastroesophageal reflux disease)   . Hyperlipidemia   . Hypertension   . Prostate cancer ([place])   . Wears glasses   . Wears partial dentures    lower    Surgical History: Past Surgical History:  Procedure Laterality Date  . GOLD SEED IMPLANT N/A 01/10/2021   Procedure: GOLD SEED IMPLANT;  Surgeon: Cleon Gustin, MD;  Location[facility];  Service: Urology;  Laterality: N/A;  . HERNIA REPAIR  yrs ago   umbilical hernia  . PROSTATE BIOPSY  11/2020  . SPACE OAR INSTILLATION N/A 01/10/2021   Procedure: SPACE OAR INSTILLATION;  Surgeon: Cleon Gustin, MD;  Location: [facility];  Service: Urology;  Laterality: N/A;  . TENDON REPAIR Left early 2021   left arm    Home Medications:  Allergies as of 02/08/2021      Reactions   Penicillins Swelling, Rash      Medication List       Accurate as of February 08, 2021  4:08 PM. If you have any questions, ask your nurse or doctor.        alfuzosin 10 MG 24 hr tablet Commonly known as: UROXATRAL Take 1 tablet (10 mg total) by mouth in the morning and at bedtime. What changed: when to take this Changed by: Nicolette Bang, MD   amLODipine-benazepril 10-20 MG capsule Commonly known as: LOTREL Take 1 capsule by mouth daily.   atorvastatin 40 MG tablet Commonly known as: LIPITOR Take 40 mg by mouth daily.    Bayer Aspirin EC Low Dose 81 MG EC tablet Generic drug: aspirin Take 81 mg by mouth daily.   EMERGEN-C VITAMIN C PO Take 1 Package by mouth daily.   FLUoxetine 40 MG capsule Commonly known as: PROZAC Take 40 mg by mouth daily.   glimepiride 2 MG tablet Commonly known as: AMARYL Take 2 mg by mouth daily with breakfast.   Jardiance 25 MG Tabs tablet Generic drug: empagliflozin Take 25 mg by mouth daily.   metFORMIN 1000 MG tablet Commonly known as: GLUCOPHAGE Take 1,000 mg by mouth 2 (two) times daily.   omeprazole 40 MG capsule Commonly known as: PRILOSEC Take 40 mg by mouth daily.       Allergies:  Allergies  Allergen Reactions  . Penicillins Swelling and Rash    Family History: Family History  Problem Relation Age of Onset  . Colon cancer Maternal Grandfather   . Heart attack Maternal Grandfather   . Breast cancer Neg Hx   . Prostate cancer Neg Hx   . Pancreatic cancer Neg Hx     Social History:  reports that he has never smoked. He quit smokeless tobacco use about 31 years ago.  His smokeless tobacco use included chew. He reports current alcohol use. He reports that he does not use drugs.  ROS: All other review of  systems were reviewed and are negative except what is noted above in HPI  Physical Exam: BP 100/67   Pulse (!) 137   Temp 98.6 F (37 C)   Ht 6\' 1"  (1.854 m)   Wt 258 lb (117 kg)   BMI 34.04 kg/m   Constitutional:  Alert and oriented, No acute distress. HEENT: [place] AT, moist mucus membranes.  Trachea midline, no masses. Cardiovascular: No clubbing, cyanosis, or edema. Respiratory: Normal respiratory effort, no increased work of breathing. GI: Abdomen is soft, nontender, nondistended, no abdominal masses GU: No CVA tenderness.  Lymph: No cervical or inguinal lymphadenopathy. Skin: No rashes, bruises or suspicious lesions. Neurologic: Grossly intact, no focal deficits, moving all 4 extremities. Psychiatric: Normal mood and  affect.  Laboratory Data: Lab Results  Component Value Date   HGB 16.3 01/10/2021   HCT 48.0 01/10/2021    Lab Results  Component Value Date   CREATININE 1.00 01/10/2021    No results found for: PSA  Lab Results  Component Value Date   TESTOSTERONE 39 (L) 12/16/2020    No results found for: HGBA1C  Urinalysis    Component Value Date/Time   COLORURINE YELLOW 07/15/2007 0841   APPEARANCEUR Clear 12/24/2020 1128   LABSPEC <1.005 (L) 07/15/2007 0841   PHURINE 6.0 07/15/2007 0841   GLUCOSEU 3+ (A) 12/24/2020 1128   HGBUR NEGATIVE 07/15/2007 0841   BILIRUBINUR Negative 12/24/2020 [place] 07/15/2007 0841   PROTEINUR Negative 12/24/2020 1128   PROTEINUR NEGATIVE 07/15/2007 0841   UROBILINOGEN 0.2 07/15/2007 0841   NITRITE Negative 12/24/2020 1128   NITRITE NEGATIVE 07/15/2007 0841   LEUKOCYTESUR Negative 12/24/2020 1128    Lab Results  Component Value Date   LABMICR Comment 12/24/2020    Pertinent Imaging:  No results found for this or any previous visit.  No results found for this or any previous visit.  No results found for this or any previous visit.  No results found for this or any previous visit.  No results found for this or any previous visit.  No results found for this or any previous visit.  No results found for this or any previous visit.  No results found for this or any previous visit.   Assessment & Plan:    1. Benign prostatic hyperplasia with urinary obstruction Continue uroxatral 10mg  BID - Urinalysis, Routine w reflex microscopic  2. Nocturia -Uroxatral 10mg  BID  3. Weak urinary stream -uroxatral 10mg  BID     Return in about 3 months (around 05/11/2021) for PSA.  Nicolette Bang, MD  [facility] [place]

## 2021-02-10 ENCOUNTER — Telehealth: Payer: Self-pay

## 2021-02-10 NOTE — Telephone Encounter (Signed)
[employer] calling to discuss his FMLA.   His date is out until the 17th.  However, he has his last treatment on the 30th.  Please call patient back at [phone].  Thanks, Helene Kelp

## 2021-02-10 NOTE — Telephone Encounter (Signed)
I called and spoke with patient.  Patient needs his FMLA paperwork finished by his radiation oncologist for his treatment plan. Pt voiced understanding.  Pt reported our FMLA paperwork is completed with no issues and he will reach out to radiation oncology to complete the other FMLA requirement he needs.

## 2021-06-08 ENCOUNTER — Other Ambulatory Visit: Payer: Self-pay

## 2021-06-08 ENCOUNTER — Other Ambulatory Visit: Payer: BC Managed Care – PPO

## 2021-06-08 DIAGNOSIS — C61 Malignant neoplasm of prostate: Secondary | ICD-10-CM

## 2021-06-09 LAB — PSA: Prostate Specific Ag, Serum: <0.1 ng/mL (ref 0.0–4.0)

## 2021-06-14 NOTE — Progress Notes (Signed)
Results mailed 

## 2021-06-15 ENCOUNTER — Other Ambulatory Visit: Payer: Self-pay

## 2021-06-15 ENCOUNTER — Encounter: Payer: Self-pay | Admitting: Urology

## 2021-06-15 ENCOUNTER — Ambulatory Visit (INDEPENDENT_AMBULATORY_CARE_PROVIDER_SITE_OTHER): Payer: BC Managed Care – PPO | Admitting: Urology

## 2021-06-15 VITALS — BP 81/64 | HR 144 | Temp 98.2°F

## 2021-06-15 DIAGNOSIS — C61 Malignant neoplasm of prostate: Secondary | ICD-10-CM | POA: Diagnosis not present

## 2021-06-15 DIAGNOSIS — R3912 Poor urinary stream: Secondary | ICD-10-CM | POA: Diagnosis not present

## 2021-06-15 DIAGNOSIS — N138 Other obstructive and reflux uropathy: Secondary | ICD-10-CM

## 2021-06-15 DIAGNOSIS — N401 Enlarged prostate with lower urinary tract symptoms: Secondary | ICD-10-CM | POA: Diagnosis not present

## 2021-06-15 LAB — URINALYSIS, ROUTINE W REFLEX MICROSCOPIC
Bilirubin, UA: NEGATIVE
Ketones, UA: NEGATIVE
Leukocytes,UA: NEGATIVE
Nitrite, UA: NEGATIVE
Protein,UA: NEGATIVE
Specific Gravity, UA: <1.005 — ABNORMAL LOW (ref 1.005–1.030)
Urobilinogen, Ur: 0.2 mg/dL (ref 0.2–1.0)
pH, UA: 5.5 (ref 5.0–7.5)

## 2021-06-15 LAB — MICROSCOPIC EXAMINATION
Bacteria, UA: NONE SEEN
Epithelial Cells (non renal): NONE SEEN /hpf (ref 0–10)
Renal Epithel, UA: NONE SEEN /hpf
WBC, UA: NONE SEEN /hpf (ref 0–5)

## 2021-06-15 MED ORDER — ALFUZOSIN HCL ER 10 MG PO TB24: 10.0000 mg | ORAL_TABLET | Freq: Two times a day (BID) | ORAL | 11 refills | Status: DC

## 2021-06-15 NOTE — Progress Notes (Signed)
Urological Symptom Review  Patient is experiencing the following symptoms: Hard to postpone urination Erection problems (male only)   Review of Systems  Gastrointestinal (upper)  : Negative for upper GI symptoms  Gastrointestinal (lower) : Negative for lower GI symptoms  Constitutional : Fatigue  Skin: Negative for skin symptoms  Eyes: Negative for eye symptoms  Ear/Nose/Throat : Negative for Ear/Nose/Throat symptoms  Hematologic/Lymphatic: Easy bruising  Cardiovascular : Negative for cardiovascular symptoms  Respiratory : Negative for respiratory symptoms  Endocrine: Negative for endocrine symptoms  Musculoskeletal: Back pain  Neurological: Dizziness  Psychologic: Negative for psychiatric symptoms

## 2021-06-15 NOTE — Progress Notes (Signed)
06/15/2021 10:52 AM   Derrick Murray [birth year] [id number]  Referring provider: Kendrick Murray, [place] [address]  Followup prostate cancer and BPH   HPI: [name] is a 60yo here for followup for BPh and prostate cancer. PSA undetectable. He is on uroxatral 10mg  BID and has moderate LUTS IPSS  10 QOL 1. He is due for ADT. No other complaints today    PMH: Past Medical History:  Diagnosis Date   Anxiety    Arthritis    Diabetes mellitus without complication ([place])    type2   GERD (gastroesophageal reflux disease)    Hyperlipidemia    Hypertension    Prostate cancer ([place])    Wears glasses    Wears partial dentures    lower    Surgical History: Past Surgical History:  Procedure Laterality Date   GOLD SEED IMPLANT N/A 01/10/2021   Procedure: GOLD SEED IMPLANT;  Surgeon: Cleon Gustin, MD;  Location: [facility];  Service: Urology;  Laterality: N/A;   HERNIA REPAIR  yrs ago   umbilical hernia   PROSTATE BIOPSY  11/2020   SPACE OAR INSTILLATION N/A 01/10/2021   Procedure: SPACE OAR INSTILLATION;  Surgeon: Cleon Gustin, MD;  Location: [facility];  Service: Urology;  Laterality: N/A;   TENDON REPAIR Left early 2021   left arm    Home Medications:  Allergies as of 06/15/2021       Reactions   Penicillins Swelling, Rash        Medication List        Accurate as of June 15, 2021 10:52 AM. If you have any questions, ask your nurse or doctor.          alfuzosin 10 MG 24 hr tablet Commonly known as: UROXATRAL Take 1 tablet (10 mg total) by mouth in the morning and at bedtime.   amLODipine-benazepril 10-20 MG capsule Commonly known as: LOTREL Take 1 capsule by mouth daily.   atorvastatin 40 MG tablet Commonly known as: LIPITOR Take 40 mg by mouth daily.   Bayer Aspirin EC Low Dose 81 MG EC tablet Generic drug: aspirin Take 81 mg by mouth daily.   EMERGEN-C VITAMIN C  PO Take 1 Package by mouth daily.   FLUoxetine 40 MG capsule Commonly known as: PROZAC Take 40 mg by mouth daily.   glimepiride 2 MG tablet Commonly known as: AMARYL Take 2 mg by mouth daily with breakfast.   Jardiance 25 MG Tabs tablet Generic drug: empagliflozin Take 25 mg by mouth daily.   metFORMIN 1000 MG tablet Commonly known as: GLUCOPHAGE Take 1,000 mg by mouth 2 (two) times daily.   omeprazole 40 MG capsule Commonly known as: PRILOSEC Take 40 mg by mouth daily.        Allergies:  Allergies  Allergen Reactions   Penicillins Swelling and Rash    Family History: Family History  Problem Relation Age of Onset   Colon cancer Maternal Grandfather    Heart attack Maternal Grandfather    Breast cancer Neg Hx    Prostate cancer Neg Hx    Pancreatic cancer Neg Hx     Social History:  reports that he has never smoked. He quit smokeless tobacco use about 31 years ago.  His smokeless tobacco use included chew. He reports current alcohol use. He reports that he does not use drugs.  ROS: All other review of systems were reviewed and are negative except what  is noted above in HPI  Physical Exam: BP (!) 81/64   Pulse (!) 144   Temp 98.2 F (36.8 C)   Constitutional:  Alert and oriented, No acute distress. HEENT: [place] AT, moist mucus membranes.  Trachea midline, no masses. Cardiovascular: No clubbing, cyanosis, or edema. Respiratory: Normal respiratory effort, no increased work of breathing. GI: Abdomen is soft, nontender, nondistended, no abdominal masses GU: No CVA tenderness.  Lymph: No cervical or inguinal lymphadenopathy. Skin: No rashes, bruises or suspicious lesions. Neurologic: Grossly intact, no focal deficits, moving all 4 extremities. Psychiatric: Normal mood and affect.  Laboratory Data: Lab Results  Component Value Date   HGB 16.3 01/10/2021   HCT 48.0 01/10/2021    Lab Results  Component Value Date   CREATININE 1.00 01/10/2021    No  results found for: PSA  Lab Results  Component Value Date   TESTOSTERONE 39 (L) 12/16/2020    No results found for: HGBA1C  Urinalysis    Component Value Date/Time   COLORURINE YELLOW 07/15/2007 0841   APPEARANCEUR Clear 02/08/2021 1612   LABSPEC <1.005 (L) 07/15/2007 0841   PHURINE 6.0 07/15/2007 0841   GLUCOSEU 3+ (A) 02/08/2021 1612   HGBUR NEGATIVE 07/15/2007 0841   BILIRUBINUR Negative 02/08/2021 [place] 07/15/2007 0841   PROTEINUR Negative 02/08/2021 1612   PROTEINUR NEGATIVE 07/15/2007 0841   UROBILINOGEN 0.2 07/15/2007 0841   NITRITE Negative 02/08/2021 1612   NITRITE NEGATIVE 07/15/2007 0841   LEUKOCYTESUR Negative 02/08/2021 1612    Lab Results  Component Value Date   LABMICR Comment 02/08/2021    Pertinent Imaging:  No results found for this or any previous visit.  No results found for this or any previous visit.  No results found for this or any previous visit.  No results found for this or any previous visit.  No results found for this or any previous visit.  No results found for this or any previous visit.  No results found for this or any previous visit.  No results found for this or any previous visit.   Assessment & Plan:    1. Prostate cancer ([place]) -RTC 6 months with PSA  2. Benign prostatic hyperplasia with urinary obstruction Continue uroxatral 10mg  qhs  3. Weak urinary stream -Continue uroxatral 10mg  QHS   No follow-ups on file.  Nicolette Bang, MD  [facility] [place]

## 2021-06-27 ENCOUNTER — Other Ambulatory Visit: Payer: Self-pay

## 2021-06-27 ENCOUNTER — Ambulatory Visit (INDEPENDENT_AMBULATORY_CARE_PROVIDER_SITE_OTHER): Payer: BC Managed Care – PPO

## 2021-06-27 DIAGNOSIS — C61 Malignant neoplasm of prostate: Secondary | ICD-10-CM

## 2021-06-27 MED ORDER — LEUPROLIDE ACETATE (6 MONTH) 45 MG ~~LOC~~ KIT
45.0000 mg | PACK | Freq: Once | SUBCUTANEOUS | Status: AC
  Administered 2021-06-27: 45 mg via SUBCUTANEOUS

## 2021-06-27 NOTE — Progress Notes (Signed)
Eligard SubQ Injection   Due to Prostate Cancer patient is present today for a Eligard Injection.  Medication: Eligard 6 month Dose: 45 mg  Location: right   Patient tolerated well, no complications were noted  Performed by: Estill Bamberg RN

## 2021-06-28 ENCOUNTER — Ambulatory Visit (HOSPITAL_COMMUNITY): Payer: BC Managed Care – PPO | Admitting: Anesthesiology

## 2021-06-28 ENCOUNTER — Ambulatory Visit (HOSPITAL_COMMUNITY)
Admission: RE | Admit: 2021-06-28 | Discharge: 2021-06-28 | Disposition: A | Discharge status: Home / Self Care | Payer: BC Managed Care – PPO | Attending: Cardiovascular Disease | Admitting: Cardiovascular Disease

## 2021-06-28 ENCOUNTER — Other Ambulatory Visit: Payer: Self-pay

## 2021-06-28 ENCOUNTER — Encounter (HOSPITAL_COMMUNITY)
Admission: RE | Disposition: A | Discharge status: Home / Self Care | Payer: Self-pay | Source: Home / Self Care | Attending: Cardiovascular Disease

## 2021-06-28 ENCOUNTER — Encounter (HOSPITAL_COMMUNITY): Payer: Self-pay | Admitting: Cardiovascular Disease

## 2021-06-28 DIAGNOSIS — Z7901 Long term (current) use of anticoagulants: Secondary | ICD-10-CM | POA: Diagnosis not present

## 2021-06-28 DIAGNOSIS — I482 Chronic atrial fibrillation, unspecified: Secondary | ICD-10-CM | POA: Insufficient documentation

## 2021-06-28 DIAGNOSIS — Z79899 Other long term (current) drug therapy: Secondary | ICD-10-CM | POA: Insufficient documentation

## 2021-06-28 HISTORY — PX: CARDIOVERSION: SHX1299

## 2021-06-28 LAB — POCT I-STAT, CHEM 8
BUN: 23 mg/dL — ABNORMAL HIGH (ref 6–20)
Calcium, Ion: 1.1 mmol/L — ABNORMAL LOW (ref 1.15–1.40)
Chloride: 104 mmol/L (ref 98–111)
Creatinine, Ser: 1.1 mg/dL (ref 0.61–1.24)
Glucose, Bld: 176 mg/dL — ABNORMAL HIGH (ref 70–99)
HCT: 46 % (ref 39.0–52.0)
Hemoglobin: 15.6 g/dL (ref 13.0–17.0)
Potassium: 4.3 mmol/L (ref 3.5–5.1)
Sodium: 138 mmol/L (ref 135–145)
TCO2: 23 mmol/L (ref 22–32)

## 2021-06-28 SURGERY — CARDIOVERSION
Anesthesia: General

## 2021-06-28 MED ORDER — SODIUM CHLORIDE 0.9 % IV SOLN
INTRAVENOUS | Status: DC | PRN
Start: 2021-06-28 — End: 2021-06-28

## 2021-06-28 MED ORDER — LIDOCAINE HCL (CARDIAC) PF 100 MG/5ML IV SOSY
PREFILLED_SYRINGE | INTRAVENOUS | Status: DC | PRN
  Administered 2021-06-28: 80 mg via INTRATRACHEAL

## 2021-06-28 NOTE — Anesthesia Procedure Notes (Signed)
Procedure Name: General with mask airway Date/Time: 06/28/2021 9:00 AM Performed by: Eligha Bridegroom, CRNA Pre-anesthesia Checklist: Patient identified, Emergency Drugs available, Suction available, Patient being monitored and Timeout performed Patient Re-evaluated:Patient Re-evaluated prior to induction Oxygen Delivery Method: Ambu bag Preoxygenation: Pre-oxygenation with 100% oxygen Induction Type: IV induction

## 2021-06-28 NOTE — Anesthesia Preprocedure Evaluation (Addendum)
Anesthesia Evaluation  Patient identified by MRN, date of birth, ID band Patient awake    Reviewed: Allergy & Precautions, NPO status , Patient's Chart, lab work & pertinent test results  Airway Mallampati: II  TM Distance: >3 FB Neck ROM: Full    Dental  (+) Partial Lower   Pulmonary neg pulmonary ROS,    Pulmonary exam normal        Cardiovascular hypertension, Pt. on medications and Pt. on home beta blockers + dysrhythmias Atrial Fibrillation  Rhythm:Irregular Rate:Normal     Neuro/Psych Anxiety negative neurological ROS     GI/Hepatic Neg liver ROS, GERD  Medicated,  Endo/Other  diabetes, Type 2  Renal/GU negative Renal ROS  negative genitourinary   Musculoskeletal  (+) Arthritis , Osteoarthritis,    Abdominal (+)  Abdomen: soft. Bowel sounds: normal.  Peds  Hematology negative hematology ROS (+)   Anesthesia Other Findings   Reproductive/Obstetrics                            Anesthesia Physical Anesthesia Plan  ASA: 3  Anesthesia Plan: General   Post-op Pain Management:    Induction: Intravenous  PONV Risk Score and Plan: 2 and Propofol infusion and Treatment may vary due to age or medical condition  Airway Management Planned: Mask  Additional Equipment: None  Intra-op Plan:   Post-operative Plan:   Informed Consent: I have reviewed the patients History and Physical, chart, labs and discussed the procedure including the risks, benefits and alternatives for the proposed anesthesia with the patient or authorized representative who has indicated his/her understanding and acceptance.     Dental advisory given  Plan Discussed with: CRNA  Anesthesia Plan Comments: (Lab Results      Component                Value               Date                      HGB                      16.3                01/10/2021                HCT                      48.0                 01/10/2021           Lab Results      Component                Value               Date                      NA                       139                 01/10/2021                K  5.1                 01/10/2021                CO2                      23                  10/21/2020                GLUCOSE                  165 (H)             01/10/2021                BUN                      34 (H)              01/10/2021                CREATININE               1.00                01/10/2021                CALCIUM                  9.4                 10/21/2020                GFRNONAA                 65                  10/21/2020                GFRAA                    75                  10/21/2020          )       Anesthesia Quick Evaluation

## 2021-06-28 NOTE — H&P (Signed)
Ref: Kendrick Ranch, MD   Subjective:  Here for external cardioversion  Objective:  Vital Signs in the last 24 hours: Temp:  [98.3 F (36.8 C)] 98.3 F (36.8 C) (07/26 0710) Weight:  [112.5 kg] 112.5 kg (07/26 0710)  Physical Exam: BP Readings from Last 1 Encounters:  06/15/21 (!) 81/64     Wt Readings from Last 1 Encounters:  06/28/21 112.5 kg    Weight change:  Body mass index is 32.72 kg/m. HEENT: [place]/AT, Eyes-Blue, Conjunctiva-Pink, Sclera-Non-icteric Neck: No JVD, No bruit, Trachea midline. Lungs:  Clear, Bilateral. Cardiac:  Irregular rhythm, normal S1 and S2, no S3. II/VI systolic murmur. Abdomen:  Soft, non-tender. BS present. Extremities:  No edema present. No cyanosis. No clubbing. CNS: AxOx3, Cranial nerves grossly intact, moves all 4 extremities.  Skin: Warm and dry.   Intake/Output from previous day: No intake/output data recorded.    Lab Results: BMET    Component Value Date/Time   NA 138 06/28/2021 0717   NA 139 01/10/2021 0930   NA 140 10/21/2020 1627   K 4.3 06/28/2021 0717   K 5.1 01/10/2021 0930   K 4.7 10/21/2020 1627   CL 104 06/28/2021 0717   CL 104 01/10/2021 0930   CL 102 10/21/2020 1627   CO2 23 10/21/2020 1627   GLUCOSE 176 (H) 06/28/2021 0717   GLUCOSE 165 (H) 01/10/2021 0930   GLUCOSE 95 10/21/2020 1627   BUN 23 (H) 06/28/2021 0717   BUN 34 (H) 01/10/2021 0930   BUN 21 10/21/2020 1627   CREATININE 1.10 06/28/2021 0717   CREATININE 1.00 01/10/2021 0930   CREATININE 1.21 10/21/2020 1627   CALCIUM 9.4 10/21/2020 1627   GFRNONAA 65 10/21/2020 1627   GFRAA 75 10/21/2020 1627   CBC    Component Value Date/Time   HGB 15.6 06/28/2021 0717   HCT 46.0 06/28/2021 0717   HEPATIC Function Panel No results for input(s): PROT in the last 8760 hours.  Invalid input(s):  ALBUMIN,  AST,  ALT,  ALKPHOS,  BILIDIR,  IBILI HEMOGLOBIN A1C No components found for: HGA1C,  MPG CARDIAC ENZYMES No results found for: CKTOTAL,  CKMB, CKMBINDEX, TROPONINI BNP No results for input(s): PROBNP in the last 8760 hours. TSH No results for input(s): TSH in the last 8760 hours. CHOLESTEROL No results for input(s): CHOL in the last 8760 hours.  Scheduled Meds: Continuous Infusions: PRN Meds:.  Assessment/Plan: Atrial fibrillation, CHA2DS2VASc score of 2  Plan: External cardioversion. Continue Eliquis and metoprolol.   LOS: 0 days   Time spent including chart review, lab review, examination, discussion with patient/Nurse : 30 min   Dixie Dials  MD  06/28/2021, 8:42 AM

## 2021-06-28 NOTE — CV Procedure (Signed)
PRE-OP DIAGNOSIS:  Atrial fibrillation, chronic .  POST-OP DIAGNOSIS:  Sinus rhythm.  OPERATOR:  Dr. Dixie Dials, MD .     ANESTHESIA:  Deep sedation with 80 mcg of propofol and 80 mg. lodocaine.  COMPLICATIONS:  None.   OPERATIVE TERM:  DC cardioversion.  The nature of the procedure, risks and alternatives were discussed with the patient who gave informed consent.  OPERATIVE TECHNIQUE:  The patient was sedated with Propofol and lidocaine.  When the patient was no longer responsive to quiet voice, DC cardioversion was performed with 120 J biphasically and synchronously.  Patient converted to sinus rhythm.  The patient was then monitored until fully alert and left the procedure area in stable condition.  IMPRESSION:  Successful DC cardioversion.

## 2021-06-28 NOTE — Transfer of Care (Addendum)
Immediate Anesthesia Transfer of Care Note  Patient: Derrick Murray  Procedure(s) Performed: CARDIOVERSION  Patient Location: Endoscopy Unit  Anesthesia Type:General  Level of Consciousness: awake, alert  and oriented  Airway & Oxygen Therapy: Patient Spontanous Breathing  Post-op Assessment: Report given to RN and Post -op Vital signs reviewed and stable  Post vital signs: Reviewed and stable  Last Vitals:  Vitals Value Taken Time  BP 149/84   Temp    Pulse 60 06/28/21 0909  Resp 17 06/28/21 0909  SpO2 96 % 06/28/21 0909  Vitals shown include unvalidated device data.  Last Pain:  Vitals:   06/28/21 0710  TempSrc: Temporal  PainSc: 0-No pain         Complications: No notable events documented.

## 2021-06-29 NOTE — Addendum Note (Signed)
Addendum  created 06/29/21 1319 by Gloris Manchester March Rummage, DO   Intraprocedure Staff edited

## 2021-06-29 NOTE — Anesthesia Postprocedure Evaluation (Signed)
Anesthesia Post Note  Patient: Derrick Murray  Procedure(s) Performed: CARDIOVERSION     Patient location during evaluation: Endoscopy Anesthesia Type: General Level of consciousness: awake and alert Pain management: pain level controlled Vital Signs Assessment: post-procedure vital signs reviewed and stable Respiratory status: spontaneous breathing, nonlabored ventilation, respiratory function stable and patient connected to nasal cannula oxygen Cardiovascular status: blood pressure returned to baseline and stable Postop Assessment: no apparent nausea or vomiting Anesthetic complications: no   No notable events documented.  Last Vitals:  Vitals:   06/28/21 0920 06/28/21 0924  BP: 136/75 (!) 152/99  Pulse: (!) 58 62  Resp: 14 15  Temp:    SpO2: 98% 99%    Last Pain:  Vitals:   06/28/21 0924  TempSrc:   PainSc: 0-No pain                 March Rummage Tatsuya Okray

## 2021-06-30 ENCOUNTER — Encounter (HOSPITAL_COMMUNITY): Payer: Self-pay | Admitting: Cardiovascular Disease

## 2021-07-02 ENCOUNTER — Encounter: Payer: Self-pay | Admitting: Urology

## 2021-07-02 NOTE — Patient Instructions (Signed)
Benign Prostatic Hyperplasia  Benign prostatic hyperplasia (BPH) is an enlarged prostate gland that is caused by the normal aging process and not by cancer. The prostate is a walnut-sized gland that is involved in the production of semen. It is located in front of the rectum and below the bladder. The bladder stores urine and the urethra is the tube that carries the urine out of the body. The prostate may get bigger asa man gets older. An enlarged prostate can press on the urethra. This can make it harder to pass urine. The build-up of urine in the bladder can cause infection. Back pressure and infection may progress to bladder damage and kidney (renal) failure. What are the causes? This condition is part of a normal aging process. However, not all men develop problems from this condition. If the prostate enlarges away from the urethra, urine flow will not be blocked. If it enlarges toward the urethra andcompresses it, there will be problems passing urine. What increases the risk? This condition is more likely to develop in men over the age of 50 years. What are the signs or symptoms? Symptoms of this condition include: Getting up often during the night to urinate. Needing to urinate frequently during the day. Difficulty starting urine flow. Decrease in size and strength of your urine stream. Leaking (dribbling) after urinating. Inability to pass urine. This needs immediate treatment. Inability to completely empty your bladder. Pain when you pass urine. This is more common if there is also an infection. Urinary tract infection (UTI). How is this diagnosed? This condition is diagnosed based on your medical history, a physical exam, and your symptoms. Tests will also be done, such as: A post-void bladder scan. This measures any amount of urine that may remain in your bladder after you finish urinating. A digital rectal exam. In a rectal exam, your health care provider checks your prostate by  putting a lubricated, gloved finger into your rectum to feel the back of your prostate gland. This exam detects the size of your gland and any abnormal lumps or growths. An exam of your urine (urinalysis). A prostate specific antigen (PSA) screening. This is a blood test used to screen for prostate cancer. An ultrasound. This test uses sound waves to electronically produce a picture of your prostate gland. Your health care provider may refer you to a specialist in kidney and prostate diseases (urologist). How is this treated? Once symptoms begin, your health care provider will monitor your condition (active surveillance or watchful waiting). Treatment for this condition will depend on the severity of your condition. Treatment may include: Observation and yearly exams. This may be the only treatment needed if your condition and symptoms are mild. Medicines to relieve your symptoms, including: Medicines to shrink the prostate. Medicines to relax the muscle of the prostate. Surgery in severe cases. Surgery may include: Prostatectomy. In this procedure, the prostate tissue is removed completely through an open incision or with a laparoscope or robotics. Transurethral resection of the prostate (TURP). In this procedure, a tool is inserted through the opening at the tip of the penis (urethra). It is used to cut away tissue of the inner core of the prostate. The pieces are removed through the same opening of the penis. This removes the blockage. Transurethral incision (TUIP). In this procedure, small cuts are made in the prostate. This lessens the prostate's pressure on the urethra. Transurethral microwave thermotherapy (TUMT). This procedure uses microwaves to create heat. The heat destroys and removes a small   amount of prostate tissue. Transurethral needle ablation (TUNA). This procedure uses radio frequencies to destroy and remove a small amount of prostate tissue. Interstitial laser coagulation (ILC).  This procedure uses a laser to destroy and remove a small amount of prostate tissue. Transurethral electrovaporization (TUVP). This procedure uses electrodes to destroy and remove a small amount of prostate tissue. Prostatic urethral lift. This procedure inserts an implant to push the lobes of the prostate away from the urethra. Follow these instructions at home: Take over-the-counter and prescription medicines only as told by your health care provider. Monitor your symptoms for any changes. Contact your health care provider with any changes. Avoid drinking large amounts of liquid before going to bed or out in public. Avoid or reduce how much caffeine or alcohol you drink. Give yourself time when you urinate. Keep all follow-up visits as told by your health care provider. This is important. Contact a health care provider if: You have unexplained back pain. Your symptoms do not get better with treatment. You develop side effects from the medicine you are taking. Your urine becomes very dark or has a bad smell. Your lower abdomen becomes distended and you have trouble passing your urine. Get help right away if: You have a fever or chills. You suddenly cannot urinate. You feel lightheaded, or very dizzy, or you faint. There are large amounts of blood or clots in the urine. Your urinary problems become hard to manage. You develop moderate to severe low back or flank pain. The flank is the side of your body between the ribs and the hip. These symptoms may represent a serious problem that is an emergency. Do not wait to see if the symptoms will go away. Get medical help right away. Call your local emergency services (911 in the [place] Do not drive yourself to the hospital. Summary Benign prostatic hyperplasia (BPH) is an enlarged prostate that is caused by the normal aging process and not by cancer. An enlarged prostate can press on the urethra. This can make it hard to pass urine. This  condition is part of a normal aging process and is more likely to develop in men over the age of 50 years. Get help right away if you suddenly cannot urinate. This information is not intended to replace advice given to you by your health care provider. Make sure you discuss any questions you have with your healthcare provider. Document Revised: 07/29/2020 Document Reviewed: 07/29/2020 Elsevier Patient Education  2022 [employer] Inc.  

## 2021-07-20 ENCOUNTER — Telehealth: Payer: Self-pay

## 2021-07-20 NOTE — Telephone Encounter (Signed)
Patient called complaining of severe burning and discomfort while trying to have a bowel movement. Patient advised that Dr. Is on vacation and he would need to consult his PCP, Cancer center, or ER. Patient voiced understanding.

## 2021-07-20 NOTE — Telephone Encounter (Signed)
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 Patient had Space OAR 01/10/21. Is this a common side effect? Please advise in his absence.

## 2021-07-21 NOTE — Telephone Encounter (Signed)
Incoming call from the pt attempting to follow up on the message left for his provider yesterday.  Derrick Murray is aware Dr. Alyson Ingles is out of he office, however, Dr. Ralene Muskrat message was relayed verbatim & the pt will contact his radiation oncologist & GI.  Thank you

## 2021-07-28 IMAGING — NM NM BONE WHOLE BODY
4 series · 4 of 4 positions shown · non-contrast
Comparison: None.

CLINICAL DATA: Prostate cancer, initial staging examination

EXAM:
NUCLEAR MEDICINE WHOLE BODY BONE SCAN
TECHNIQUE: Whole body anterior and posterior images were obtained approximately
3 hours after intravenous injection of radiopharmaceutical.
RADIOPHARMACEUTICALS:  20.6 mCi 5echnetium-33m MDP IV

[Series 1: wbr_bone_40 whole body · 2.66mm/px · 1 of 1 slices shown (1 of 2)]
[im 1/1]
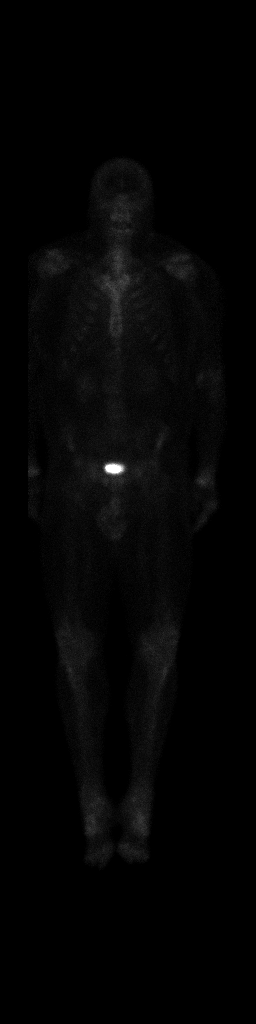

[Series 1: wbr_bone_40 whole body · 2.66mm/px · 1 of 1 slices shown (2 of 2)]
[im 1/1]
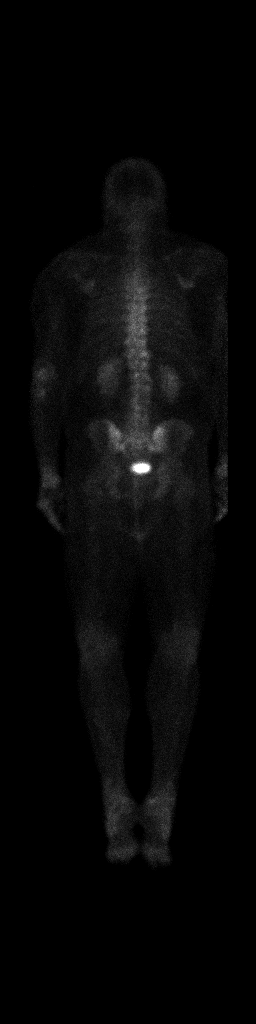

[Series 1: whole body · 2.66mm/px · 1 of 1 slices shown (1 of 2)]
[im 1/1]
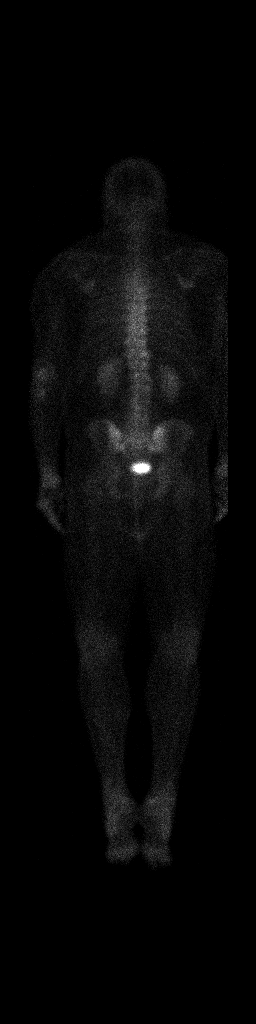

[Series 1: whole body · 2.66mm/px · 1 of 1 slices shown (2 of 2)]
[im 1/1]
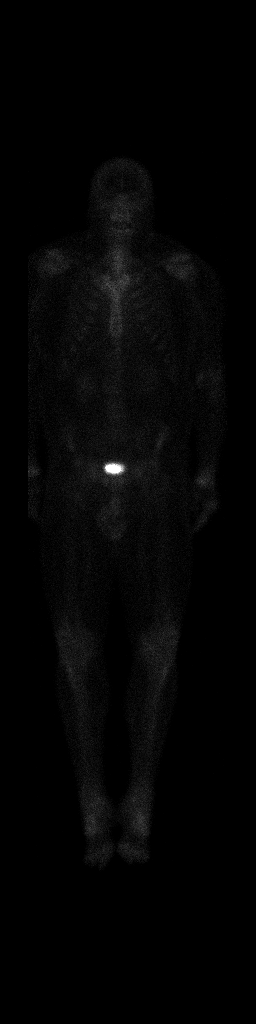

[4 of 4 positions shown; findings below may reference images not displayed]

FINDINGS: Whole body delayed stick graphic images were obtained. Uptake within
the shoulders, knees, and left elbow are likely degenerative in
nature. No focal uptake identified to suggest metastatic disease.
Normal soft tissue distribution. Normal uptake and excretion within
the kidneys and bladder.
IMPRESSION: No evidence of osseous metastatic disease.

## 2021-11-14 ENCOUNTER — Other Ambulatory Visit: Payer: Self-pay

## 2021-11-14 ENCOUNTER — Inpatient Hospital Stay (HOSPITAL_COMMUNITY)
Admission: RE | Admit: 2021-11-14 | Discharge: 2021-11-16 | DRG: 247 | Disposition: A | Discharge status: Home / Self Care | Payer: BC Managed Care – PPO | Attending: Cardiovascular Disease | Admitting: Cardiovascular Disease

## 2021-11-14 DIAGNOSIS — Z6832 Body mass index (BMI) 32.0-32.9, adult: Secondary | ICD-10-CM

## 2021-11-14 DIAGNOSIS — Z955 Presence of coronary angioplasty implant and graft: Secondary | ICD-10-CM

## 2021-11-14 DIAGNOSIS — I10 Essential (primary) hypertension: Secondary | ICD-10-CM | POA: Diagnosis present

## 2021-11-14 DIAGNOSIS — Z8546 Personal history of malignant neoplasm of prostate: Secondary | ICD-10-CM

## 2021-11-14 DIAGNOSIS — Z880 Allergy status to penicillin: Secondary | ICD-10-CM

## 2021-11-14 DIAGNOSIS — Z20822 Contact with and (suspected) exposure to covid-19: Secondary | ICD-10-CM | POA: Diagnosis present

## 2021-11-14 DIAGNOSIS — E785 Hyperlipidemia, unspecified: Secondary | ICD-10-CM | POA: Diagnosis present

## 2021-11-14 DIAGNOSIS — Z87891 Personal history of nicotine dependence: Secondary | ICD-10-CM

## 2021-11-14 DIAGNOSIS — R9439 Abnormal result of other cardiovascular function study: Secondary | ICD-10-CM | POA: Diagnosis present

## 2021-11-14 DIAGNOSIS — M1990 Unspecified osteoarthritis, unspecified site: Secondary | ICD-10-CM | POA: Diagnosis present

## 2021-11-14 DIAGNOSIS — Z8249 Family history of ischemic heart disease and other diseases of the circulatory system: Secondary | ICD-10-CM

## 2021-11-14 DIAGNOSIS — Z7984 Long term (current) use of oral hypoglycemic drugs: Secondary | ICD-10-CM

## 2021-11-14 DIAGNOSIS — I25110 Atherosclerotic heart disease of native coronary artery with unstable angina pectoris: Secondary | ICD-10-CM | POA: Diagnosis present

## 2021-11-14 DIAGNOSIS — K219 Gastro-esophageal reflux disease without esophagitis: Secondary | ICD-10-CM | POA: Diagnosis present

## 2021-11-14 DIAGNOSIS — I2510 Atherosclerotic heart disease of native coronary artery without angina pectoris: Secondary | ICD-10-CM | POA: Diagnosis present

## 2021-11-14 DIAGNOSIS — F419 Anxiety disorder, unspecified: Secondary | ICD-10-CM | POA: Diagnosis present

## 2021-11-14 DIAGNOSIS — I4891 Unspecified atrial fibrillation: Secondary | ICD-10-CM | POA: Diagnosis present

## 2021-11-14 DIAGNOSIS — E669 Obesity, unspecified: Secondary | ICD-10-CM | POA: Diagnosis present

## 2021-11-14 DIAGNOSIS — I249 Acute ischemic heart disease, unspecified: Principal | ICD-10-CM | POA: Diagnosis present

## 2021-11-14 DIAGNOSIS — Z79899 Other long term (current) drug therapy: Secondary | ICD-10-CM

## 2021-11-14 DIAGNOSIS — Z7901 Long term (current) use of anticoagulants: Secondary | ICD-10-CM

## 2021-11-14 DIAGNOSIS — E119 Type 2 diabetes mellitus without complications: Secondary | ICD-10-CM | POA: Diagnosis present

## 2021-11-14 LAB — CBC WITH DIFFERENTIAL/PLATELET
Abs Immature Granulocytes: 0.02 10*3/uL (ref 0.00–0.07)
Basophils Absolute: 0 10*3/uL (ref 0.0–0.1)
Basophils Relative: 1 %
Eosinophils Absolute: 0.1 10*3/uL (ref 0.0–0.5)
Eosinophils Relative: 3 %
HCT: 40.6 % (ref 39.0–52.0)
Hemoglobin: 14.2 g/dL (ref 13.0–17.0)
Immature Granulocytes: 0 %
Lymphocytes Relative: 20 %
Lymphs Abs: 0.9 10*3/uL (ref 0.7–4.0)
MCH: 33 pg (ref 26.0–34.0)
MCHC: 35 g/dL (ref 30.0–36.0)
MCV: 94.4 fL (ref 80.0–100.0)
Monocytes Absolute: 0.5 10*3/uL (ref 0.1–1.0)
Monocytes Relative: 10 %
Neutro Abs: 3 10*3/uL (ref 1.7–7.7)
Neutrophils Relative %: 66 %
Platelets: 199 10*3/uL (ref 150–400)
RBC: 4.3 MIL/uL (ref 4.22–5.81)
RDW: 13.2 % (ref 11.5–15.5)
WBC: 4.5 10*3/uL (ref 4.0–10.5)
nRBC: 0 % (ref 0.0–0.2)

## 2021-11-14 LAB — COMPREHENSIVE METABOLIC PANEL
ALT: 28 U/L (ref 0–44)
AST: 24 U/L (ref 15–41)
Albumin: 3.8 g/dL (ref 3.5–5.0)
Alkaline Phosphatase: 73 U/L (ref 38–126)
Anion gap: 7 (ref 5–15)
BUN: 17 mg/dL (ref 8–23)
CO2: 27 mmol/L (ref 22–32)
Calcium: 9.3 mg/dL (ref 8.9–10.3)
Chloride: 103 mmol/L (ref 98–111)
Creatinine, Ser: 1.22 mg/dL (ref 0.61–1.24)
GFR, Estimated: >60 mL/min (ref >60)
Glucose, Bld: 104 mg/dL — ABNORMAL HIGH (ref 70–99)
Potassium: 4.2 mmol/L (ref 3.5–5.1)
Sodium: 137 mmol/L (ref 135–145)
Total Bilirubin: 0.7 mg/dL (ref 0.3–1.2)
Total Protein: 6.6 g/dL (ref 6.5–8.1)

## 2021-11-14 LAB — TROPONIN I (HIGH SENSITIVITY): Troponin I (High Sensitivity): 12 ng/L (ref <18)

## 2021-11-14 LAB — GLUCOSE, CAPILLARY: Glucose-Capillary: 100 mg/dL — ABNORMAL HIGH (ref 70–99)

## 2021-11-14 LAB — HIV ANTIBODY (ROUTINE TESTING W REFLEX): HIV Screen 4th Generation wRfx: NONREACTIVE

## 2021-11-14 MED ORDER — METOPROLOL TARTRATE 12.5 MG HALF TABLET: 12.5000 mg | ORAL_TABLET | Freq: Two times a day (BID) | ORAL | Status: DC

## 2021-11-14 MED ORDER — ASPIRIN EC 81 MG PO TBEC
81.0000 mg | DELAYED_RELEASE_TABLET | Freq: Every day | ORAL | Status: DC
  Administered 2021-11-15 – 2021-11-16 (×2): 81 mg via ORAL
  Filled 2021-11-14 (×2): qty 1

## 2021-11-14 MED ORDER — ALFUZOSIN HCL ER 10 MG PO TB24
10.0000 mg | ORAL_TABLET | Freq: Every day | ORAL | Status: DC
  Administered 2021-11-15 – 2021-11-16 (×2): 10 mg via ORAL
  Filled 2021-11-14 (×2): qty 1

## 2021-11-14 MED ORDER — FLUOXETINE HCL 20 MG PO CAPS
40.0000 mg | ORAL_CAPSULE | Freq: Every morning | ORAL | Status: DC
  Administered 2021-11-15 – 2021-11-16 (×2): 40 mg via ORAL
  Filled 2021-11-14 (×2): qty 2

## 2021-11-14 MED ORDER — NITROGLYCERIN 0.4 MG SL SUBL: 0.4000 mg | SUBLINGUAL_TABLET | SUBLINGUAL | Status: DC | PRN

## 2021-11-14 MED ORDER — METOPROLOL SUCCINATE ER 50 MG PO TB24
50.0000 mg | ORAL_TABLET | Freq: Every day | ORAL | Status: DC
  Administered 2021-11-15 – 2021-11-16 (×2): 50 mg via ORAL
  Filled 2021-11-14 (×2): qty 1

## 2021-11-14 MED ORDER — ZOLPIDEM TARTRATE 5 MG PO TABS
5.0000 mg | ORAL_TABLET | Freq: Once | ORAL | Status: AC
Start: 2021-11-14 — End: 2021-11-14
  Administered 2021-11-14: 5 mg via ORAL
  Filled 2021-11-14: qty 1

## 2021-11-14 MED ORDER — MUPIROCIN 2 % EX OINT
1.0000 "application " | TOPICAL_OINTMENT | Freq: Two times a day (BID) | CUTANEOUS | Status: DC
  Administered 2021-11-15 (×2): 1 via NASAL
  Filled 2021-11-14: qty 22

## 2021-11-14 MED ORDER — ONDANSETRON HCL 4 MG/2ML IJ SOLN: 4.0000 mg | Freq: Four times a day (QID) | INTRAMUSCULAR | Status: DC | PRN

## 2021-11-14 MED ORDER — EMPAGLIFLOZIN 25 MG PO TABS
25.0000 mg | ORAL_TABLET | Freq: Every morning | ORAL | Status: DC
  Administered 2021-11-15 – 2021-11-16 (×2): 25 mg via ORAL
  Filled 2021-11-14 (×2): qty 1

## 2021-11-14 MED ORDER — ATORVASTATIN CALCIUM 80 MG PO TABS: 80.0000 mg | ORAL_TABLET | Freq: Every day | ORAL | Status: DC

## 2021-11-14 MED ORDER — AMLODIPINE BESYLATE 10 MG PO TABS
10.0000 mg | ORAL_TABLET | Freq: Every day | ORAL | Status: DC
  Administered 2021-11-15 – 2021-11-16 (×2): 10 mg via ORAL
  Filled 2021-11-14 (×2): qty 1

## 2021-11-14 MED ORDER — PANTOPRAZOLE SODIUM 40 MG PO TBEC
40.0000 mg | DELAYED_RELEASE_TABLET | Freq: Every day | ORAL | Status: DC
  Administered 2021-11-15 – 2021-11-16 (×2): 40 mg via ORAL
  Filled 2021-11-14 (×2): qty 1

## 2021-11-14 MED ORDER — BENAZEPRIL HCL 20 MG PO TABS
20.0000 mg | ORAL_TABLET | Freq: Every day | ORAL | Status: DC
  Administered 2021-11-15 – 2021-11-16 (×2): 20 mg via ORAL
  Filled 2021-11-14 (×2): qty 1

## 2021-11-14 MED ORDER — SODIUM CHLORIDE 0.9 % IV SOLN: INTRAVENOUS | Status: DC

## 2021-11-14 MED ORDER — ASPIRIN 81 MG PO CHEW
324.0000 mg | CHEWABLE_TABLET | ORAL | Status: AC
  Administered 2021-11-14: 324 mg via ORAL
  Filled 2021-11-14: qty 4

## 2021-11-14 MED ORDER — ASPIRIN 300 MG RE SUPP
300.0000 mg | RECTAL | Status: AC
  Filled 2021-11-14: qty 1

## 2021-11-14 MED ORDER — ATORVASTATIN CALCIUM 40 MG PO TABS
40.0000 mg | ORAL_TABLET | Freq: Every morning | ORAL | Status: DC
  Administered 2021-11-15 – 2021-11-16 (×2): 40 mg via ORAL
  Filled 2021-11-14 (×2): qty 1

## 2021-11-14 MED ORDER — ACETAMINOPHEN 325 MG PO TABS
650.0000 mg | ORAL_TABLET | ORAL | Status: DC | PRN
  Administered 2021-11-14: 650 mg via ORAL
  Filled 2021-11-14: qty 2

## 2021-11-14 MED ORDER — HEPARIN (PORCINE) 25000 UT/250ML-% IV SOLN
1450.0000 [IU]/h | INTRAVENOUS | Status: DC
  Administered 2021-11-15: 1250 [IU]/h via INTRAVENOUS
  Filled 2021-11-14: qty 250

## 2021-11-14 NOTE — H&P (Signed)
Referring Physician: Daleen Squibb, MD  Derrick Murray is an 60 y.o. male.                       Chief Complaint: Chest pain  HPI: 60 years old white male with PMH of HTN, HLD, DMT2, s/p prostate cancer and atrial fibrillation has recurrent chest pain in last 6 months but more so in last 1 month with exertional shortness of breath.   Past Medical History:  Diagnosis Date   Anxiety    Arthritis    Diabetes mellitus without complication ([place])    type2   GERD (gastroesophageal reflux disease)    Hyperlipidemia    Hypertension    Prostate cancer ([place])    Wears glasses    Wears partial dentures    lower      Past Surgical History:  Procedure Laterality Date   CARDIOVERSION N/A 06/28/2021   Procedure: CARDIOVERSION;  Surgeon: Dixie Dials, MD;  Location: [facility];  Service: Cardiovascular;  Laterality: N/A;   GOLD SEED IMPLANT N/A 01/10/2021   Procedure: GOLD SEED IMPLANT;  Surgeon: Cleon Gustin, MD;  Location[facility];  Service: Urology;  Laterality: N/A;   HERNIA REPAIR  yrs ago   umbilical hernia   PROSTATE BIOPSY  11/2020   SPACE OAR INSTILLATION N/A 01/10/2021   Procedure: SPACE OAR INSTILLATION;  Surgeon: Cleon Gustin, MD;  Location: [facility];  Service: Urology;  Laterality: N/A;   TENDON REPAIR Left early 2021   left arm    Family History  Problem Relation Age of Onset   Colon cancer Maternal Grandfather    Heart attack Maternal Grandfather    Breast cancer Neg Hx    Prostate cancer Neg Hx    Pancreatic cancer Neg Hx    Social History:  reports that he has never smoked. He quit smokeless tobacco use about 31 years ago.  His smokeless tobacco use included chew. He reports current alcohol use. He reports that he does not use drugs.  Allergies:  Allergies  Allergen Reactions   Penicillins Swelling and Rash    Medications Prior to Admission  Medication Sig Dispense Refill   alfuzosin (UROXATRAL) 10 MG 24 hr tablet  Take 1 tablet (10 mg total) by mouth in the morning and at bedtime. 60 tablet 11   amLODipine-benazepril (LOTREL) 10-20 MG per capsule Take 1 capsule by mouth in the morning.     apixaban (ELIQUIS) 5 MG TABS tablet Take 5 mg by mouth 2 (two) times daily.     atorvastatin (LIPITOR) 40 MG tablet Take 40 mg by mouth in the morning.     FLUoxetine (PROZAC) 40 MG capsule Take 40 mg by mouth in the morning.     hydrocortisone cream 1 % Apply 1 application topically as needed for itching (hemmorrhoids).     JARDIANCE 25 MG TABS tablet Take 25 mg by mouth in the morning.     metFORMIN (GLUCOPHAGE) 1000 MG tablet Take 1,000 mg by mouth 2 (two) times daily.     metoprolol succinate (TOPROL-XL) 50 MG 24 hr tablet Take 50 mg by mouth daily.     omeprazole (PRILOSEC) 40 MG capsule Take 40 mg by mouth in the morning.     shark liver oil-cocoa butter (PREPARATION H) 0.25-3-85.5 % suppository Place 1 suppository rectally as needed for hemorrhoids.      No results found for this or any previous visit (from the past 48  hour(s)). No results found.  Review Of Systems Constitutional: No fever, chills, weight loss or gain. Eyes: No vision change, wears glasses. No discharge or pain. Ears: No hearing loss, No tinnitus. Respiratory: No asthma, COPD, pneumonias. Positive shortness of breath. No hemoptysis. Cardiovascular: Positive chest pain, palpitation, no leg edema. Gastrointestinal: No nausea, vomiting, diarrhea, constipation. No GI bleed. No hepatitis. Genitourinary: No dysuria, hematuria, kidney stone. No incontinance. H/O prostate cancer Neurological: No headache, stroke, seizures.  Psychiatry: No psych facility admission for anxiety, depression, suicide. No detox. Skin: No rash. Musculoskeletal: Positive joint pain, no fibromyalgia. No neck pain, back pain. Lymphadenopathy: No lymphadenopathy. Hematology: No anemia or easy bruising.   Blood pressure (!) 136/92, pulse 76, temperature 98 F (36.7 C),  temperature source Oral, resp. rate 15, SpO2 99 %. There is no height or weight on file to calculate BMI. General appearance: alert, cooperative, appears stated age and no distress Head: Normocephalic, atraumatic. Eyes: Blue eyes, pink conjunctiva, corneas clear.  Neck: No adenopathy, no carotid bruit, no JVD, supple, symmetrical, trachea midline and thyroid not enlarged. Resp: Clear to auscultation bilaterally. Cardio: Regular rate and rhythm, S1, S2 normal, II/VI systolic murmur, no click, rub or gallop GI: Soft, non-tender; bowel sounds normal; no organomegaly. Extremities: No edema, cyanosis or clubbing. Skin: Warm and dry.  Neurologic: Alert and oriented X 3, normal strength. Normal coordination and gait.  Assessment/Plan Acute coronary syndrome HTN HLD Obesity  Plan: Cardiac cath due to typical angina symptoms, multiple risk factors and known abnormal stress test. Patient understood procedure, risks and alternatives  Time spent: Review of old records, Lab, x-rays, EKG, other cardiac tests, examination, discussion with patient over 70 minutes.  Birdie Riddle, MD  11/14/2021, 6:58 PM

## 2021-11-14 NOTE — Progress Notes (Signed)
ANTICOAGULATION CONSULT NOTE - Initial Consult  Pharmacy Consult for IV Heparin Indication: chest pain/ACS  Allergies  Allergen Reactions   Penicillins Swelling and Rash    Patient Measurements:   Last available weight in system: 115.5 kg from 08/2021 Height 6'1" Heparin Dosing Weight: 105 kg  Vital Signs: Temp: 98 F (36.7 C) (12/12 1857) Temp Source: Oral (12/12 1857) BP: 136/92 (12/12 1857) Pulse Rate: 76 (12/12 1857)  Labs: No results for input(s): HGB, HCT, PLT, APTT, LABPROT, INR, HEPARINUNFRC, HEPRLOWMOCWT, CREATININE, CKTOTAL, CKMB, TROPONINIHS in the last 72 hours.  CrCl cannot be calculated (Patient's most recent lab result is older than the maximum 21 days allowed.).   Medical History: Past Medical History:  Diagnosis Date   Anxiety    Arthritis    Diabetes mellitus without complication ([place])    type2   GERD (gastroesophageal reflux disease)    Hyperlipidemia    Hypertension    Prostate cancer ([place])    Wears glasses    Wears partial dentures    lower    Medications:  Infusions:   sodium chloride      Assessment: 60 years of age male with history of atrial fibrillation on Eliquis prior to admission who has been admitted for ACS and plan for cardiac cath. Pharmacy consulted to start IV Heparin.   Last Eliquis dose was at 1600 PM today.  CBC within normal limits. SCr 1.22. No bleeding reported.   Goal of Therapy:  Heparin level 0.3-0.7 units/ml Monitor platelets by anticoagulation protocol: Yes   Plan:  Start IV Heparin 12 hours AFTER last dose of Eliquis - at 0400 AM on 12/13 at rate of 1250 units/hr. No bolus.  aPTT and Heparin level in 6 hours.  Daily aPTT and Heparin level until correlating.  Daily CBC and monitor for bleeding.    Sloan Leiter, PharmD, BCPS, BCCCP Clinical Pharmacist Please refer to [facility] for [place] numbers 11/14/2021,7:05 PM

## 2021-11-14 NOTE — Plan of Care (Signed)
  Problem: Cardiovascular: Goal: Ability to achieve and maintain adequate cardiovascular perfusion will improve Outcome: Progressing   

## 2021-11-15 ENCOUNTER — Encounter (HOSPITAL_COMMUNITY)
Admission: RE | Disposition: A | Discharge status: Home / Self Care | Payer: Self-pay | Source: Home / Self Care | Attending: Cardiovascular Disease

## 2021-11-15 ENCOUNTER — Encounter (HOSPITAL_COMMUNITY): Payer: Self-pay | Admitting: Cardiovascular Disease

## 2021-11-15 DIAGNOSIS — Z8249 Family history of ischemic heart disease and other diseases of the circulatory system: Secondary | ICD-10-CM | POA: Diagnosis not present

## 2021-11-15 DIAGNOSIS — Z7901 Long term (current) use of anticoagulants: Secondary | ICD-10-CM | POA: Diagnosis not present

## 2021-11-15 DIAGNOSIS — I249 Acute ischemic heart disease, unspecified: Secondary | ICD-10-CM | POA: Diagnosis not present

## 2021-11-15 DIAGNOSIS — M1990 Unspecified osteoarthritis, unspecified site: Secondary | ICD-10-CM | POA: Diagnosis present

## 2021-11-15 DIAGNOSIS — Z7984 Long term (current) use of oral hypoglycemic drugs: Secondary | ICD-10-CM | POA: Diagnosis not present

## 2021-11-15 DIAGNOSIS — R079 Chest pain, unspecified: Secondary | ICD-10-CM | POA: Diagnosis present

## 2021-11-15 DIAGNOSIS — Z87891 Personal history of nicotine dependence: Secondary | ICD-10-CM | POA: Diagnosis not present

## 2021-11-15 DIAGNOSIS — I10 Essential (primary) hypertension: Secondary | ICD-10-CM | POA: Diagnosis not present

## 2021-11-15 DIAGNOSIS — I25110 Atherosclerotic heart disease of native coronary artery with unstable angina pectoris: Secondary | ICD-10-CM | POA: Diagnosis not present

## 2021-11-15 DIAGNOSIS — Z6832 Body mass index (BMI) 32.0-32.9, adult: Secondary | ICD-10-CM | POA: Diagnosis not present

## 2021-11-15 DIAGNOSIS — E669 Obesity, unspecified: Secondary | ICD-10-CM | POA: Diagnosis not present

## 2021-11-15 DIAGNOSIS — E119 Type 2 diabetes mellitus without complications: Secondary | ICD-10-CM | POA: Diagnosis not present

## 2021-11-15 DIAGNOSIS — I4891 Unspecified atrial fibrillation: Secondary | ICD-10-CM | POA: Diagnosis not present

## 2021-11-15 DIAGNOSIS — Z8546 Personal history of malignant neoplasm of prostate: Secondary | ICD-10-CM | POA: Diagnosis not present

## 2021-11-15 DIAGNOSIS — E785 Hyperlipidemia, unspecified: Secondary | ICD-10-CM | POA: Diagnosis not present

## 2021-11-15 DIAGNOSIS — K219 Gastro-esophageal reflux disease without esophagitis: Secondary | ICD-10-CM | POA: Diagnosis present

## 2021-11-15 DIAGNOSIS — R9439 Abnormal result of other cardiovascular function study: Secondary | ICD-10-CM | POA: Diagnosis present

## 2021-11-15 DIAGNOSIS — Z880 Allergy status to penicillin: Secondary | ICD-10-CM | POA: Diagnosis not present

## 2021-11-15 DIAGNOSIS — Z79899 Other long term (current) drug therapy: Secondary | ICD-10-CM | POA: Diagnosis not present

## 2021-11-15 DIAGNOSIS — F419 Anxiety disorder, unspecified: Secondary | ICD-10-CM | POA: Diagnosis present

## 2021-11-15 DIAGNOSIS — Z20822 Contact with and (suspected) exposure to covid-19: Secondary | ICD-10-CM | POA: Diagnosis not present

## 2021-11-15 HISTORY — PX: CORONARY STENT INTERVENTION: CATH118234

## 2021-11-15 HISTORY — PX: LEFT HEART CATH AND CORONARY ANGIOGRAPHY: CATH118249

## 2021-11-15 LAB — LIPID PANEL
Cholesterol: 133 mg/dL (ref 0–200)
HDL: 41 mg/dL (ref >40)
LDL Cholesterol: 49 mg/dL (ref 0–99)
Total CHOL/HDL Ratio: 3.2 RATIO
Triglycerides: 215 mg/dL — ABNORMAL HIGH (ref <150)
VLDL: 43 mg/dL — ABNORMAL HIGH (ref 0–40)

## 2021-11-15 LAB — BASIC METABOLIC PANEL
Anion gap: 11 (ref 5–15)
BUN: 19 mg/dL (ref 8–23)
CO2: 23 mmol/L (ref 22–32)
Calcium: 8.9 mg/dL (ref 8.9–10.3)
Chloride: 104 mmol/L (ref 98–111)
Creatinine, Ser: 1.06 mg/dL (ref 0.61–1.24)
GFR, Estimated: >60 mL/min (ref >60)
Glucose, Bld: 92 mg/dL (ref 70–99)
Potassium: 3.8 mmol/L (ref 3.5–5.1)
Sodium: 138 mmol/L (ref 135–145)

## 2021-11-15 LAB — SURGICAL PCR SCREEN
MRSA, PCR: NEGATIVE
Staphylococcus aureus: NEGATIVE

## 2021-11-15 LAB — CBC
HCT: 38.5 % — ABNORMAL LOW (ref 39.0–52.0)
HCT: 44.8 % (ref 39.0–52.0)
Hemoglobin: 13.6 g/dL (ref 13.0–17.0)
Hemoglobin: 15.8 g/dL (ref 13.0–17.0)
MCH: 33.1 pg (ref 26.0–34.0)
MCH: 32.6 pg (ref 26.0–34.0)
MCHC: 35.3 g/dL (ref 30.0–36.0)
MCHC: 35.3 g/dL (ref 30.0–36.0)
MCV: 93.7 fL (ref 80.0–100.0)
MCV: 92.6 fL (ref 80.0–100.0)
Platelets: 188 10*3/uL (ref 150–400)
Platelets: 204 10*3/uL (ref 150–400)
RBC: 4.11 MIL/uL — ABNORMAL LOW (ref 4.22–5.81)
RBC: 4.84 MIL/uL (ref 4.22–5.81)
RDW: 13.2 % (ref 11.5–15.5)
RDW: 13.1 % (ref 11.5–15.5)
WBC: 4.9 10*3/uL (ref 4.0–10.5)
WBC: 7.2 10*3/uL (ref 4.0–10.5)
nRBC: 0 % (ref 0.0–0.2)
nRBC: 0 % (ref 0.0–0.2)

## 2021-11-15 LAB — POCT ACTIVATED CLOTTING TIME: Activated Clotting Time: 311 seconds

## 2021-11-15 LAB — GLUCOSE, CAPILLARY
Glucose-Capillary: 102 mg/dL — ABNORMAL HIGH (ref 70–99)
Glucose-Capillary: 97 mg/dL (ref 70–99)
Glucose-Capillary: 151 mg/dL — ABNORMAL HIGH (ref 70–99)

## 2021-11-15 LAB — APTT: aPTT: 57 seconds — ABNORMAL HIGH (ref 24–36)

## 2021-11-15 LAB — RESP PANEL BY RT-PCR (FLU A&B, COVID) ARPGX2
Influenza A by PCR: NEGATIVE
Influenza B by PCR: NEGATIVE
SARS Coronavirus 2 by RT PCR: NEGATIVE

## 2021-11-15 LAB — HEPARIN LEVEL (UNFRACTIONATED): Heparin Unfractionated: >1.1 IU/mL — ABNORMAL HIGH (ref 0.30–0.70)

## 2021-11-15 LAB — CREATININE, SERUM
Creatinine, Ser: 1.06 mg/dL (ref 0.61–1.24)
GFR, Estimated: >60 mL/min (ref >60)

## 2021-11-15 LAB — TROPONIN I (HIGH SENSITIVITY): Troponin I (High Sensitivity): 13 ng/L (ref <18)

## 2021-11-15 SURGERY — LEFT HEART CATH AND CORONARY ANGIOGRAPHY
Anesthesia: LOCAL

## 2021-11-15 MED ORDER — FENTANYL CITRATE (PF) 100 MCG/2ML IJ SOLN
INTRAMUSCULAR | Status: DC | PRN
  Administered 2021-11-15 (×3): 25 ug via INTRAVENOUS

## 2021-11-15 MED ORDER — FENTANYL CITRATE (PF) 100 MCG/2ML IJ SOLN
INTRAMUSCULAR | Status: AC
  Filled 2021-11-15: qty 2

## 2021-11-15 MED ORDER — MIDAZOLAM HCL 2 MG/2ML IJ SOLN
INTRAMUSCULAR | Status: DC | PRN
  Administered 2021-11-15 (×2): 1 mg via INTRAVENOUS

## 2021-11-15 MED ORDER — HEPARIN SODIUM (PORCINE) 5000 UNIT/ML IJ SOLN
5000.0000 [IU] | Freq: Three times a day (TID) | INTRAMUSCULAR | Status: DC
  Administered 2021-11-16: 07:00:00 5000 [IU] via SUBCUTANEOUS
  Filled 2021-11-15: qty 1

## 2021-11-15 MED ORDER — MIDAZOLAM HCL 2 MG/2ML IJ SOLN
INTRAMUSCULAR | Status: DC | PRN
  Administered 2021-11-15 (×2): 1 mg via INTRAVENOUS
  Administered 2021-11-15: 2 mg via INTRAVENOUS

## 2021-11-15 MED ORDER — FENTANYL CITRATE (PF) 100 MCG/2ML IJ SOLN
INTRAMUSCULAR | Status: DC | PRN
  Administered 2021-11-15 (×2): 25 ug via INTRAVENOUS

## 2021-11-15 MED ORDER — HEPARIN SODIUM (PORCINE) 1000 UNIT/ML IJ SOLN
INTRAMUSCULAR | Status: DC | PRN
  Administered 2021-11-15: 6000 [IU] via INTRAVENOUS

## 2021-11-15 MED ORDER — HEPARIN SODIUM (PORCINE) 1000 UNIT/ML IJ SOLN
INTRAMUSCULAR | Status: DC | PRN
  Administered 2021-11-15: 3000 [IU] via INTRAVENOUS
  Administered 2021-11-15: 2000 [IU] via INTRAVENOUS

## 2021-11-15 MED ORDER — LIDOCAINE HCL (PF) 1 % IJ SOLN
INTRAMUSCULAR | Status: AC
  Filled 2021-11-15: qty 30

## 2021-11-15 MED ORDER — NITROGLYCERIN 1 MG/10 ML FOR IR/CATH LAB
INTRA_ARTERIAL | Status: AC
  Filled 2021-11-15: qty 10

## 2021-11-15 MED ORDER — MIDAZOLAM HCL 2 MG/2ML IJ SOLN
INTRAMUSCULAR | Status: AC
  Filled 2021-11-15: qty 2

## 2021-11-15 MED ORDER — IOHEXOL 350 MG/ML SOLN
INTRAVENOUS | Status: DC | PRN
  Administered 2021-11-15: 135 mL

## 2021-11-15 MED ORDER — HEPARIN (PORCINE) IN NACL 1000-0.9 UT/500ML-% IV SOLN
INTRAVENOUS | Status: AC
  Filled 2021-11-15: qty 1000

## 2021-11-15 MED ORDER — HYDRALAZINE HCL 20 MG/ML IJ SOLN
10.0000 mg | INTRAMUSCULAR | Status: AC | PRN
  Administered 2021-11-15: 10 mg via INTRAVENOUS
  Filled 2021-11-15: qty 1

## 2021-11-15 MED ORDER — NITROGLYCERIN 1 MG/10 ML FOR IR/CATH LAB
INTRA_ARTERIAL | Status: DC | PRN
  Administered 2021-11-15 (×2): 200 ug via INTRACORONARY

## 2021-11-15 MED ORDER — CLOPIDOGREL BISULFATE 300 MG PO TABS
ORAL_TABLET | ORAL | Status: DC | PRN
  Administered 2021-11-15: 600 mg via ORAL

## 2021-11-15 MED ORDER — SODIUM CHLORIDE 0.9% FLUSH: 3.0000 mL | INTRAVENOUS | Status: DC | PRN

## 2021-11-15 MED ORDER — SODIUM CHLORIDE 0.9 % IV SOLN: 250.0000 mL | INTRAVENOUS | Status: DC | PRN

## 2021-11-15 MED ORDER — HEPARIN (PORCINE) IN NACL 2000-0.9 UNIT/L-% IV SOLN
INTRAVENOUS | Status: DC | PRN
  Administered 2021-11-15: 1000 mL

## 2021-11-15 MED ORDER — LIDOCAINE HCL (PF) 1 % IJ SOLN
INTRAMUSCULAR | Status: DC | PRN
  Administered 2021-11-15: 2 mL

## 2021-11-15 MED ORDER — NITROGLYCERIN 1 MG/10 ML FOR IR/CATH LAB
INTRA_ARTERIAL | Status: DC | PRN
  Administered 2021-11-15: 200 ug

## 2021-11-15 MED ORDER — SODIUM CHLORIDE 0.9% FLUSH
3.0000 mL | Freq: Two times a day (BID) | INTRAVENOUS | Status: DC
  Administered 2021-11-16: 09:00:00 3 mL via INTRAVENOUS

## 2021-11-15 MED ORDER — VERAPAMIL HCL 2.5 MG/ML IV SOLN: INTRAVENOUS | Status: DC | PRN

## 2021-11-15 MED ORDER — MORPHINE SULFATE (PF) 2 MG/ML IV SOLN
1.0000 mg | INTRAVENOUS | Status: DC | PRN
Start: 2021-11-15 — End: 2021-11-16
  Filled 2021-11-15: qty 1

## 2021-11-15 MED ORDER — LABETALOL HCL 5 MG/ML IV SOLN: 10.0000 mg | INTRAVENOUS | Status: AC | PRN

## 2021-11-15 MED ORDER — IOHEXOL 350 MG/ML SOLN
INTRAVENOUS | Status: DC | PRN
  Administered 2021-11-15: 80 mL

## 2021-11-15 MED ORDER — CLOPIDOGREL BISULFATE 300 MG PO TABS
ORAL_TABLET | ORAL | Status: AC
  Filled 2021-11-15: qty 2

## 2021-11-15 MED ORDER — SODIUM CHLORIDE 0.9 % IV SOLN: INTRAVENOUS | Status: AC

## 2021-11-15 MED ORDER — HEPARIN SODIUM (PORCINE) 1000 UNIT/ML IJ SOLN
INTRAMUSCULAR | Status: AC
  Filled 2021-11-15: qty 10

## 2021-11-15 SURGICAL SUPPLY — 20 items
BALLN SAPPHIRE 3.5X15 (BALLOONS) ×3
BALLN SCOREFLEX 3.0X15 (BALLOONS) ×3
BALLOON SAPPHIRE 3.5X15 (BALLOONS) IMPLANT
BALLOON SCOREFLEX 3.0X15 (BALLOONS) IMPLANT
CATH 5FR JL3.5 JR4 ANG PIG MP (CATHETERS) ×2 IMPLANT
CATH LAUNCHER 6FR EBU3.5 (CATHETERS) ×2 IMPLANT
DEVICE RAD COMP TR BAND LRG (VASCULAR PRODUCTS) ×2 IMPLANT
GLIDESHEATH SLEND SS 6F .021 (SHEATH) ×2 IMPLANT
GUIDEWIRE INQWIRE 1.5J.035X260 (WIRE) IMPLANT
INQWIRE 1.5J .035X260CM (WIRE) ×3
KIT ENCORE 26 ADVANTAGE (KITS) ×2 IMPLANT
KIT ESSENTIALS PG (KITS) ×2 IMPLANT
KIT HEART LEFT (KITS) ×4 IMPLANT
PACK CARDIAC CATHETERIZATION (CUSTOM PROCEDURE TRAY) ×4 IMPLANT
STENT ONYX FRONTIER 3.5X18 (Permanent Stent) ×2 IMPLANT
SYR MEDRAD MARK 7 150ML (SYRINGE) ×4 IMPLANT
SYR MEDRAD MARK V 150ML (SYRINGE) IMPLANT
TRANSDUCER W/STOPCOCK (MISCELLANEOUS) ×4 IMPLANT
WIRE ASAHI PROWATER 180CM (WIRE) ×2 IMPLANT
WIRE MAILMAN 182CM (WIRE) ×2 IMPLANT

## 2021-11-15 NOTE — Interval H&P Note (Signed)
History and Physical Interval Note:  11/15/2021 3:06 PM  Derrick Murray  has presented today for surgery, with the diagnosis of unstable angina.  The various methods of treatment have been discussed with the patient and family. After consideration of risks, benefits and other options for treatment, the patient has consented to  Procedure(s): LEFT HEART CATH AND CORONARY ANGIOGRAPHY (N/A) as a surgical intervention.  The patient's history has been reviewed, patient examined, no change in status, stable for surgery.  I have reviewed the patient's chart and labs.  Questions were answered to the patient's satisfaction.     Birdie Riddle

## 2021-11-15 NOTE — Progress Notes (Signed)
ANTICOAGULATION CONSULT NOTE - Initial Consult  Pharmacy Consult for IV Heparin Indication: chest pain/ACS  Allergies  Allergen Reactions   Penicillins Swelling and Rash    Patient Measurements: Height: 6\' 1"  (185.4 cm) Weight: 118.1 kg (260 lb 5.8 oz) IBW/kg (Calculated) : 79.9 Heparin Dosing Weight: 105 kg  Vital Signs: Temp: 97.9 F (36.6 C) (12/13 0500) Temp Source: Oral (12/13 0500) BP: 169/97 (12/13 0813) Pulse Rate: 59 (12/13 0813)  Labs: Recent Labs    11/14/21 1907 11/14/21 2158 11/15/21 0304 11/15/21 1029  HGB 14.2  --  13.6  --   HCT 40.6  --  38.5*  --   PLT 199  --  188  --   APTT  --   --   --  57*  HEPARINUNFRC  --   --   --  >1.10*  CREATININE 1.22  --  1.06  --   TROPONINIHS  --  12 13  --     Estimated Creatinine Clearance: 98.5 mL/min (by C-G formula based on SCr of 1.06 mg/dL).   Medical History: Past Medical History:  Diagnosis Date   Anxiety    Arthritis    Diabetes mellitus without complication ([place])    type2   GERD (gastroesophageal reflux disease)    Hyperlipidemia    Hypertension    Prostate cancer ([place])    Wears glasses    Wears partial dentures    lower    Medications:  Infusions:   sodium chloride 75 mL/hr at 11/15/21 1002   heparin 1,250 Units/hr (11/15/21 0421)    Assessment: 60 years of age male with history of atrial fibrillation on Eliquis prior to admission who has been admitted for ACS and plan for cardiac cath. Pharmacy consulted to start IV Heparin.   Last apixaban dose was at 1600 PM 12/12.  Heparin level >1 which is expected given recent apixaban use. Aptt below goal at 57s, no bleeding issues noted. CBC stable overnight.  Goal of Therapy:  Heparin level 0.3-0.7 units/ml Monitor platelets by anticoagulation protocol: Yes   Plan:  Increase heparin to 1450 units/hr aPTT in 6-8 hours Daily aPTT and Heparin level until correlating.  Follow up after cath   Erin Hearing PharmD., BCPS Clinical  Pharmacist 11/15/2021 11:48 AM

## 2021-11-16 ENCOUNTER — Encounter (HOSPITAL_COMMUNITY): Payer: Self-pay | Admitting: Cardiovascular Disease

## 2021-11-16 LAB — BASIC METABOLIC PANEL
Anion gap: 8 (ref 5–15)
BUN: 15 mg/dL (ref 8–23)
CO2: 24 mmol/L (ref 22–32)
Calcium: 8.8 mg/dL — ABNORMAL LOW (ref 8.9–10.3)
Chloride: 102 mmol/L (ref 98–111)
Creatinine, Ser: 0.95 mg/dL (ref 0.61–1.24)
GFR, Estimated: >60 mL/min (ref >60)
Glucose, Bld: 110 mg/dL — ABNORMAL HIGH (ref 70–99)
Potassium: 3.7 mmol/L (ref 3.5–5.1)
Sodium: 134 mmol/L — ABNORMAL LOW (ref 135–145)

## 2021-11-16 LAB — HEMOGLOBIN A1C
Hgb A1c MFr Bld: 7.5 % — ABNORMAL HIGH (ref 4.8–5.6)
Mean Plasma Glucose: 168.55 mg/dL

## 2021-11-16 LAB — GLUCOSE, CAPILLARY
Glucose-Capillary: 135 mg/dL — ABNORMAL HIGH (ref 70–99)
Glucose-Capillary: 121 mg/dL — ABNORMAL HIGH (ref 70–99)

## 2021-11-16 MED ORDER — CLOPIDOGREL BISULFATE 75 MG PO TABS: 75.0000 mg | ORAL_TABLET | Freq: Every day | ORAL | 11 refills | Status: AC

## 2021-11-16 MED ORDER — PANTOPRAZOLE SODIUM 40 MG PO TBEC: 40.0000 mg | DELAYED_RELEASE_TABLET | Freq: Every day | ORAL | 6 refills | Status: AC

## 2021-11-16 MED ORDER — ASPIRIN 81 MG PO TBEC: 81.0000 mg | DELAYED_RELEASE_TABLET | Freq: Every day | ORAL | 11 refills | Status: DC

## 2021-11-16 MED ORDER — METFORMIN HCL 1000 MG PO TABS: 1000.0000 mg | ORAL_TABLET | Freq: Two times a day (BID) | ORAL | Status: DC

## 2021-11-16 MED ORDER — NITROGLYCERIN 0.4 MG SL SUBL
0.4000 mg | SUBLINGUAL_TABLET | SUBLINGUAL | 1 refills | Status: AC | PRN
Start: 2021-11-16 — End: ?

## 2021-11-16 MED ORDER — MUPIROCIN 2 % EX OINT: 1.0000 "application " | TOPICAL_OINTMENT | Freq: Two times a day (BID) | CUTANEOUS | 0 refills | Status: DC

## 2021-11-16 MED ORDER — ASPIRIN 81 MG PO TBEC: 81.0000 mg | DELAYED_RELEASE_TABLET | Freq: Every day | ORAL | 0 refills | Status: AC

## 2021-11-16 NOTE — Progress Notes (Signed)
CARDIAC REHAB PHASE I   PRE:  Rate/Rhythm: 75 SR    BP: sitting 127/68    SaO2:   MODE:  Ambulation: 470 ft   POST:  Rate/Rhythm: 86 SR    BP: sitting 147/90     SaO2:   Tolerated well, feels much better. Discussed importance of Plavix, stent, restrictions, diet, exercise, NTG, and CRPII. Pt receptive. Will refer to [place], ACSM 11/16/2021 8:55 AM

## 2021-11-16 NOTE — Discharge Summary (Signed)
Physician Discharge Summary  Patient ID: Derrick Murray MRN: [id number] DOB/AGE: September 18, 1961 60 y.o.  Admit date: 11/14/2021 Discharge date: 11/16/2021  Admission Diagnoses: Acute coronary syndrome HTN HLD Obesity  Discharge Diagnoses:  Principal Problem:   Acute coronary syndrome [facility]) Active Problems: LAD stent Chest pain T2DM HTN HLD Obesity S/P prostate cancer  Discharged Condition: good  Hospital Course: 60 years old white male with PMH of HTN, HLD, T2DM, S/P prostate cancer had exertional chest discomfort and shortness of breath with abnormal stress test few months back. His troponin I was normal. He underwent cardiac catheterization and jad significant proximal LAD disease and moderate RCA disease. He had 3.5 x 18 mm Onyx drug eluting stent placed by Dr. Virgina Jock with good result.  Post procedure had had chest pain that resolved with morphine use. His right radial cath site was stable and he ambulated well without chest pain today.  He was discharged home with f/u by me in 1 week and Dr. Lunette Stands in 1 month.  Consults: cardiology  Significant Diagnostic Studies: labs: Near normal CBC and BMET. Elevated Hgb A1C of 7.5 %. LDL cholesterol of 49 mg.  EKG: Normal sinus rhythm.  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  Treatments: cardiac meds: benazepril (Lotensin), metoprolol, amlodipine, and Plavix. He will take aspirin 81 mg. For 1 month and continue Eliquis and Plavix for 1 year, then Eliquis only.  Discharge Exam: Blood pressure (!) 134/99, pulse 71, temperature 97.8 F (36.6 C), temperature source Oral, resp. rate 14, height 6\' 1"  (1.854 m), weight 112.2 kg, SpO2 99 %. General appearance: alert, cooperative and appears stated age. Head: Normocephalic, atraumatic. Eyes: Blue eyes, pink conjunctiva, corneas clear.   Neck: No adenopathy, no carotid bruit, no  JVD, supple, symmetrical, trachea midline and thyroid not enlarged. Resp: Clear to auscultation bilaterally. Cardio: Regular rate and rhythm, S1, S2 normal, II/VI systolic murmur, no click, rub or gallop. GI: Soft, non-tender; bowel sounds normal; no organomegaly. Extremities: No edema, cyanosis or clubbing. Stable right radial cath site. Skin: Warm and dry.  Neurologic: Alert and oriented X 3, normal strength and tone. Normal coordination and gait.  Disposition: Discharge disposition: 01-Home or Self Care       Discharge Instructions     Amb Referral to Cardiac Rehabilitation   Complete by: As directed    To [place]   Diagnosis:  Coronary Stents PTCA     After initial evaluation and assessments completed: Virtual Based Care may be provided alone or in conjunction with Phase 2 Cardiac Rehab based on patient barriers.: Yes      Allergies as of 11/16/2021       Reactions   Penicillins Swelling, Rash        Medication List     STOP taking these medications    lisinopril 10 MG tablet Commonly known as: ZESTRIL   omeprazole 40 MG capsule Commonly known as: PRILOSEC Replaced by: pantoprazole 40 MG tablet       TAKE these medications    alfuzosin 10 MG 24 hr tablet Commonly known as: UROXATRAL Take 1 tablet (10 mg total) by mouth in the morning and at bedtime.   amLODipine-benazepril 10-20 MG capsule Commonly known as: LOTREL Take 1 capsule by mouth in the morning.   apixaban 5 MG Tabs tablet Commonly known as: ELIQUIS Take 5 mg by mouth 2 (two) times daily.   aspirin 81  MG EC tablet Take 1 tablet (81 mg total) by mouth daily. Swallow whole. Stop medication after 30 days. Continue Eliquis and Clopidogrel.   atorvastatin 40 MG tablet Commonly known as: LIPITOR Take 40 mg by mouth in the morning.   clopidogrel 75 MG tablet Commonly known as: Plavix Take 1 tablet (75 mg total) by mouth daily.   FLUoxetine 40 MG capsule Commonly known as: PROZAC Take  40 mg by mouth in the morning.   hydrocortisone cream 1 % Apply 1 application topically as needed for itching (hemmorrhoids).   Jardiance 25 MG Tabs tablet Generic drug: empagliflozin Take 25 mg by mouth in the morning.   metFORMIN 1000 MG tablet Commonly known as: GLUCOPHAGE Take 1 tablet (1,000 mg total) by mouth 2 (two) times daily. Start from Friday-11/18/2021 What changed: additional instructions   metoprolol succinate 50 MG 24 hr tablet Commonly known as: TOPROL-XL Take 50 mg by mouth daily.   mupirocin ointment 2 % Commonly known as: BACTROBAN Place 1 application into the nose 2 (two) times daily.   nitroGLYCERIN 0.4 MG SL tablet Commonly known as: NITROSTAT Place 1 tablet (0.4 mg total) under the tongue every 5 (five) minutes x 3 doses as needed for chest pain.   pantoprazole 40 MG tablet Commonly known as: PROTONIX Take 1 tablet (40 mg total) by mouth daily. Replaces: omeprazole 40 MG capsule        Follow-up Information     Vasireddy, Lanetta Inch, MD Follow up in 1 month(s).   Specialty: Internal Medicine Contact information: [place] [address] [phone]         Dixie Dials, MD Follow up in 1 week(s).   Specialty: Cardiology Contact information: [place] [address] [phone]                 Time spent: Review of old chart, current chart, lab, x-ray, cardiac tests and discussion with patient over 60 minutes.  Signed: Birdie Riddle 11/16/2021, 9:19 AM

## 2021-11-18 ENCOUNTER — Telehealth (HOSPITAL_COMMUNITY): Payer: Self-pay

## 2021-11-18 NOTE — Telephone Encounter (Signed)
Per phase I cardiac rehab, fax cardiac rehab referral to [facility] cardiac rehab.

## 2021-11-30 ENCOUNTER — Emergency Department (HOSPITAL_COMMUNITY): Payer: BC Managed Care – PPO

## 2021-11-30 ENCOUNTER — Encounter (HOSPITAL_COMMUNITY): Payer: Self-pay | Admitting: *Deleted

## 2021-11-30 ENCOUNTER — Other Ambulatory Visit: Payer: Self-pay

## 2021-11-30 ENCOUNTER — Inpatient Hospital Stay (HOSPITAL_COMMUNITY)
Admission: EM | Admit: 2021-11-30 | Discharge: 2021-12-02 | DRG: 287 | Disposition: A | Discharge status: Home / Self Care | Payer: BC Managed Care – PPO | Attending: Cardiovascular Disease | Admitting: Cardiovascular Disease

## 2021-11-30 ENCOUNTER — Observation Stay
Admission: AD | Admit: 2021-11-30 | Payer: BC Managed Care – PPO | Source: Ambulatory Visit | Admitting: Cardiovascular Disease

## 2021-11-30 DIAGNOSIS — E785 Hyperlipidemia, unspecified: Secondary | ICD-10-CM | POA: Diagnosis present

## 2021-11-30 DIAGNOSIS — Z7984 Long term (current) use of oral hypoglycemic drugs: Secondary | ICD-10-CM | POA: Diagnosis not present

## 2021-11-30 DIAGNOSIS — I25110 Atherosclerotic heart disease of native coronary artery with unstable angina pectoris: Secondary | ICD-10-CM | POA: Diagnosis not present

## 2021-11-30 DIAGNOSIS — Z7982 Long term (current) use of aspirin: Secondary | ICD-10-CM | POA: Diagnosis not present

## 2021-11-30 DIAGNOSIS — R0602 Shortness of breath: Secondary | ICD-10-CM | POA: Diagnosis not present

## 2021-11-30 DIAGNOSIS — Z7901 Long term (current) use of anticoagulants: Secondary | ICD-10-CM

## 2021-11-30 DIAGNOSIS — Z87891 Personal history of nicotine dependence: Secondary | ICD-10-CM | POA: Diagnosis not present

## 2021-11-30 DIAGNOSIS — Z8249 Family history of ischemic heart disease and other diseases of the circulatory system: Secondary | ICD-10-CM

## 2021-11-30 DIAGNOSIS — Z8546 Personal history of malignant neoplasm of prostate: Secondary | ICD-10-CM

## 2021-11-30 DIAGNOSIS — Z800 Family history of malignant neoplasm of digestive organs: Secondary | ICD-10-CM | POA: Diagnosis not present

## 2021-11-30 DIAGNOSIS — I4892 Unspecified atrial flutter: Secondary | ICD-10-CM | POA: Diagnosis not present

## 2021-11-30 DIAGNOSIS — I482 Chronic atrial fibrillation, unspecified: Secondary | ICD-10-CM | POA: Diagnosis not present

## 2021-11-30 DIAGNOSIS — E119 Type 2 diabetes mellitus without complications: Secondary | ICD-10-CM | POA: Diagnosis present

## 2021-11-30 DIAGNOSIS — Z20822 Contact with and (suspected) exposure to covid-19: Secondary | ICD-10-CM | POA: Diagnosis not present

## 2021-11-30 DIAGNOSIS — E669 Obesity, unspecified: Secondary | ICD-10-CM | POA: Diagnosis present

## 2021-11-30 DIAGNOSIS — Z880 Allergy status to penicillin: Secondary | ICD-10-CM | POA: Diagnosis not present

## 2021-11-30 DIAGNOSIS — M1990 Unspecified osteoarthritis, unspecified site: Secondary | ICD-10-CM | POA: Diagnosis present

## 2021-11-30 DIAGNOSIS — K219 Gastro-esophageal reflux disease without esophagitis: Secondary | ICD-10-CM | POA: Diagnosis present

## 2021-11-30 DIAGNOSIS — Z955 Presence of coronary angioplasty implant and graft: Secondary | ICD-10-CM | POA: Diagnosis not present

## 2021-11-30 DIAGNOSIS — F419 Anxiety disorder, unspecified: Secondary | ICD-10-CM | POA: Diagnosis present

## 2021-11-30 DIAGNOSIS — R072 Precordial pain: Secondary | ICD-10-CM

## 2021-11-30 DIAGNOSIS — Z7902 Long term (current) use of antithrombotics/antiplatelets: Secondary | ICD-10-CM

## 2021-11-30 DIAGNOSIS — I10 Essential (primary) hypertension: Secondary | ICD-10-CM | POA: Diagnosis not present

## 2021-11-30 DIAGNOSIS — Z79899 Other long term (current) drug therapy: Secondary | ICD-10-CM

## 2021-11-30 DIAGNOSIS — I249 Acute ischemic heart disease, unspecified: Secondary | ICD-10-CM | POA: Diagnosis present

## 2021-11-30 HISTORY — DX: Atherosclerotic heart disease of native coronary artery without angina pectoris: I25.10

## 2021-11-30 LAB — BASIC METABOLIC PANEL
Anion gap: 12 (ref 5–15)
BUN: 26 mg/dL — ABNORMAL HIGH (ref 8–23)
CO2: 19 mmol/L — ABNORMAL LOW (ref 22–32)
Calcium: 9.2 mg/dL (ref 8.9–10.3)
Chloride: 103 mmol/L (ref 98–111)
Creatinine, Ser: 1.23 mg/dL (ref 0.61–1.24)
GFR, Estimated: >60 mL/min (ref >60)
Glucose, Bld: 145 mg/dL — ABNORMAL HIGH (ref 70–99)
Potassium: 3.9 mmol/L (ref 3.5–5.1)
Sodium: 134 mmol/L — ABNORMAL LOW (ref 135–145)

## 2021-11-30 LAB — TROPONIN I (HIGH SENSITIVITY)
Troponin I (High Sensitivity): 11 ng/L (ref <18)
Troponin I (High Sensitivity): 8 ng/L (ref <18)

## 2021-11-30 LAB — CBC
HCT: 47.6 % (ref 39.0–52.0)
Hemoglobin: 16.3 g/dL (ref 13.0–17.0)
MCH: 32.4 pg (ref 26.0–34.0)
MCHC: 34.2 g/dL (ref 30.0–36.0)
MCV: 94.6 fL (ref 80.0–100.0)
Platelets: 254 10*3/uL (ref 150–400)
RBC: 5.03 MIL/uL (ref 4.22–5.81)
RDW: 13.5 % (ref 11.5–15.5)
WBC: 7.6 10*3/uL (ref 4.0–10.5)
nRBC: 0 % (ref 0.0–0.2)

## 2021-11-30 LAB — RESP PANEL BY RT-PCR (FLU A&B, COVID) ARPGX2
Influenza A by PCR: NEGATIVE
Influenza B by PCR: NEGATIVE
SARS Coronavirus 2 by RT PCR: NEGATIVE

## 2021-11-30 LAB — MAGNESIUM: Magnesium: 1.9 mg/dL (ref 1.7–2.4)

## 2021-11-30 LAB — PROTIME-INR
INR: 1.2 (ref 0.8–1.2)
Prothrombin Time: 15.3 seconds — ABNORMAL HIGH (ref 11.4–15.2)

## 2021-11-30 MED ORDER — EMPAGLIFLOZIN 25 MG PO TABS
25.0000 mg | ORAL_TABLET | Freq: Every day | ORAL | Status: DC
  Administered 2021-12-02: 09:00:00 25 mg via ORAL
  Filled 2021-11-30 (×2): qty 1

## 2021-11-30 MED ORDER — SODIUM CHLORIDE 0.9 % IV SOLN: INTRAVENOUS | Status: DC

## 2021-11-30 MED ORDER — SUCRALFATE 1 G PO TABS
1.0000 g | ORAL_TABLET | Freq: Four times a day (QID) | ORAL | Status: DC
  Administered 2021-12-01 – 2021-12-02 (×4): 1 g via ORAL
  Filled 2021-11-30 (×4): qty 1

## 2021-11-30 MED ORDER — METOPROLOL SUCCINATE ER 25 MG PO TB24
50.0000 mg | ORAL_TABLET | Freq: Every day | ORAL | Status: DC
  Filled 2021-11-30: qty 2

## 2021-11-30 MED ORDER — ACETAMINOPHEN 325 MG PO TABS: 650.0000 mg | ORAL_TABLET | ORAL | Status: DC | PRN

## 2021-11-30 MED ORDER — ZOLPIDEM TARTRATE 5 MG PO TABS
5.0000 mg | ORAL_TABLET | Freq: Every evening | ORAL | Status: DC | PRN
  Administered 2021-11-30 – 2021-12-01 (×2): 5 mg via ORAL
  Filled 2021-11-30 (×2): qty 1

## 2021-11-30 MED ORDER — ONDANSETRON HCL 4 MG/2ML IJ SOLN: 4.0000 mg | Freq: Four times a day (QID) | INTRAMUSCULAR | Status: DC | PRN

## 2021-11-30 MED ORDER — FLUOXETINE HCL 20 MG PO CAPS
40.0000 mg | ORAL_CAPSULE | Freq: Every morning | ORAL | Status: DC
  Administered 2021-12-01 – 2021-12-02 (×2): 40 mg via ORAL
  Filled 2021-11-30 (×2): qty 2

## 2021-11-30 MED ORDER — CLOPIDOGREL BISULFATE 75 MG PO TABS
75.0000 mg | ORAL_TABLET | Freq: Every day | ORAL | Status: DC
  Administered 2021-12-01 – 2021-12-02 (×2): 75 mg via ORAL
  Filled 2021-11-30 (×2): qty 1

## 2021-11-30 MED ORDER — ASPIRIN EC 81 MG PO TBEC
81.0000 mg | DELAYED_RELEASE_TABLET | Freq: Every day | ORAL | Status: DC
  Administered 2021-12-01 – 2021-12-02 (×2): 81 mg via ORAL
  Filled 2021-11-30 (×2): qty 1

## 2021-11-30 MED ORDER — PANTOPRAZOLE SODIUM 40 MG PO TBEC
40.0000 mg | DELAYED_RELEASE_TABLET | Freq: Every day | ORAL | Status: DC
  Administered 2021-12-01 – 2021-12-02 (×2): 40 mg via ORAL
  Filled 2021-11-30 (×2): qty 1

## 2021-11-30 MED ORDER — ATORVASTATIN CALCIUM 40 MG PO TABS
40.0000 mg | ORAL_TABLET | Freq: Every morning | ORAL | Status: DC
  Administered 2021-12-01 – 2021-12-02 (×2): 40 mg via ORAL
  Filled 2021-11-30 (×2): qty 1

## 2021-11-30 MED ORDER — NITROGLYCERIN 0.4 MG SL SUBL: 0.4000 mg | SUBLINGUAL_TABLET | SUBLINGUAL | Status: DC | PRN

## 2021-11-30 MED ORDER — ALFUZOSIN HCL ER 10 MG PO TB24
10.0000 mg | ORAL_TABLET | Freq: Two times a day (BID) | ORAL | Status: DC
  Administered 2021-12-02: 09:00:00 10 mg via ORAL
  Filled 2021-11-30 (×4): qty 1

## 2021-11-30 NOTE — H&P (Signed)
Referring Physician: Daleen Squibb, MD  Derrick Murray is an 60 y.o. male.                       Chief Complaint: Chest pain and palpitation  HPI: 60 years old white male with PMH of HTN, HLD, S/P prostate cancer, atrial fibrillation on Eliquis and LAD stent 11/15/2021 has recurrent palpitation, dizziness and chest pressure responding to SL NTG use. Troponin I level is normal. EKG shows atrial fibrillation with RVR and post SL NTG use chest pressure is down to 4/10 and HR is in 90's/min.  Past Medical History:  Diagnosis Date   Anxiety    Arthritis    Coronary artery disease    Diabetes mellitus without complication ([place])    type2   GERD (gastroesophageal reflux disease)    Hyperlipidemia    Hypertension    Prostate cancer ([place])    Wears glasses    Wears partial dentures    lower      Past Surgical History:  Procedure Laterality Date   CARDIOVERSION N/A 06/28/2021   Procedure: CARDIOVERSION;  Surgeon: Dixie Dials, MD;  Location: [facility];  Service: Cardiovascular;  Laterality: N/A;   CORONARY STENT INTERVENTION N/A 11/15/2021   Procedure: CORONARY STENT INTERVENTION;  Surgeon: Nigel Mormon, MD;  Location: [place] [facility];  Service: Cardiovascular;  Laterality: N/A;   GOLD SEED IMPLANT N/A 01/10/2021   Procedure: GOLD SEED IMPLANT;  Surgeon: Cleon Gustin, MD;  Location[facility];  Service: Urology;  Laterality: N/A;   HERNIA REPAIR  yrs ago   umbilical hernia   LEFT HEART CATH AND CORONARY ANGIOGRAPHY N/A 11/15/2021   Procedure: LEFT HEART CATH AND CORONARY ANGIOGRAPHY;  Surgeon: Dixie Dials, MD;  Location: [place] [facility];  Service: Cardiovascular;  Laterality: N/A;   PROSTATE BIOPSY  11/2020   SPACE OAR INSTILLATION N/A 01/10/2021   Procedure: SPACE OAR INSTILLATION;  Surgeon: Cleon Gustin, MD;  Location: [facility];  Service: Urology;  Laterality: N/A;   TENDON REPAIR Left early 2021   left arm    Family  History  Problem Relation Age of Onset   Colon cancer Maternal Grandfather    Heart attack Maternal Grandfather    Breast cancer Neg Hx    Prostate cancer Neg Hx    Pancreatic cancer Neg Hx    Social History:  reports that he has never smoked. He quit smokeless tobacco use about 32 years ago.  His smokeless tobacco use included chew. He reports current alcohol use. He reports that he does not use drugs.  Allergies:  Allergies  Allergen Reactions   Penicillins Swelling and Rash    (Not in a hospital admission)   Results for orders placed or performed during the hospital encounter of 11/30/21 (from the past 48 hour(s))  Basic metabolic panel     Status: Abnormal   Collection Time: 11/30/21  2:03 PM  Result Value Ref Range   Sodium 134 (L) 135 - 145 mmol/L   Potassium 3.9 3.5 - 5.1 mmol/L   Chloride 103 98 - 111 mmol/L   CO2 19 (L) 22 - 32 mmol/L   Glucose, Bld 145 (H) 70 - 99 mg/dL    Comment: Glucose reference range applies only to samples taken after fasting for at least 8 hours.   BUN 26 (H) 8 - 23 mg/dL   Creatinine, Ser 1.23 0.61 - 1.24 mg/dL   Calcium 9.2 8.9 -  10.3 mg/dL   GFR, Estimated >60 >60 mL/min    Comment: (NOTE) Calculated using the CKD-EPI Creatinine Equation (2021)    Anion gap 12 5 - 15    Comment: Performed at [facility], [place] [address], [place] [address]  CBC     Status: None   Collection Time: 11/30/21  2:03 PM  Result Value Ref Range   WBC 7.6 4.0 - 10.5 K/uL   RBC 5.03 4.22 - 5.81 MIL/uL   Hemoglobin 16.3 13.0 - 17.0 g/dL   HCT 47.6 39.0 - 52.0 %   MCV 94.6 80.0 - 100.0 fL   MCH 32.4 26.0 - 34.0 pg   MCHC 34.2 30.0 - 36.0 g/dL   RDW 13.5 11.5 - 15.5 %   Platelets 254 150 - 400 K/uL   nRBC 0.0 0.0 - 0.2 %    Comment: Performed at [facility], [place] [address], [place], [place] [address]  Troponin I (High Sensitivity)     Status: None   Collection Time: 11/30/21  2:03 PM  Result Value Ref Range   Troponin I (High  Sensitivity) 11 <18 ng/L    Comment: (NOTE) Elevated high sensitivity troponin I (hsTnI) values and significant  changes across serial measurements may suggest ACS but many other  chronic and acute conditions are known to elevate hsTnI results.  Refer to the "Links" section for chest pain algorithms and additional  guidance. Performed at [facility], [place] [address] [place], [place] [address]   Magnesium     Status: None   Collection Time: 11/30/21  2:03 PM  Result Value Ref Range   Magnesium 1.9 1.7 - 2.4 mg/dL    Comment: Performed at [facility], [place] [address] [place], [place] [address]  Protime-INR (if pt is taking coumadin)     Status: Abnormal   Collection Time: 11/30/21  2:03 PM  Result Value Ref Range   Prothrombin Time 15.3 (H) 11.4 - 15.2 seconds   INR 1.2 0.8 - 1.2    Comment: (NOTE) INR goal varies based on device and disease states. Performed at [facility], [place] [address] [place], [place] [address]    DG Chest 2 View  Result Date: 11/30/2021 CLINICAL DATA:  Chest pain. EXAM: CHEST - 2 VIEW COMPARISON:  Chest x-ray dated October 14, 2021. FINDINGS: The heart size and mediastinal contours are within normal limits. Both lungs are clear. The visualized skeletal structures are unremarkable. IMPRESSION: No active cardiopulmonary disease. Electronically Signed   By: Titus Dubin M.D.   On: 11/30/2021 15:27    Review Of Systems Constitutional: No fever, chills, weight loss or gain. Eyes: No vision change, wears glasses. No discharge or pain. Ears: No hearing loss, No tinnitus. Respiratory: No asthma, COPD, pneumonias. Positive shortness of breath. No hemoptysis. Cardiovascular: Positive chest pain, palpitation, no leg edema. Gastrointestinal: No nausea, vomiting, diarrhea, constipation. No GI bleed. No hepatitis. Genitourinary: No dysuria, hematuria, kidney stone. No incontinance. H/O prostate cancer. Neurological: No headache, stroke,  seizures.  Psychiatry: No psych facility admission for anxiety, depression, suicide. No detox. Skin: No rash. Musculoskeletal: Positive joint pain, fibromyalgia. No neck pain, back pain. Lymphadenopathy: No lymphadenopathy. Hematology: No anemia or easy bruising.   Blood pressure (!) 139/97, pulse 87, temperature 98.5 F (36.9 C), temperature source Oral, resp. rate 12, SpO2 100 %. There is no height or weight on file to calculate BMI. General appearance: alert, cooperative, appears stated age and  no distress Head: Normocephalic, atraumatic. Eyes: Blue eyes, pink conjunctiva, corneas clear.  Neck: No adenopathy, no carotid bruit, no JVD, supple, symmetrical, trachea midline and thyroid not enlarged. Resp: Clear to auscultation bilaterally. Cardio: Regular rate and rhythm, S1, S2 normal, II/VI systolic murmur, no click, rub or gallop GI: Soft, non-tender; bowel sounds normal; no organomegaly. Extremities: No edema, cyanosis or clubbing. Skin: Warm and dry.  Neurologic: Alert and oriented X 3, normal strength. Normal coordination and gait.  Assessment/Plan Acute coronary syndrome CAD S/P LAD stent Atrial fibrillation with RVR, CHA2DS2ASc score of 2 HTN HLD Obesity  Plan: Cardiac cath in AM. Increase metoprolol dose.  Time spent: Review of old records, Lab, x-rays, EKG, other cardiac tests, examination, discussion with patient over 70 minutes.  Birdie Riddle, MD  11/30/2021, 4:51 PM

## 2021-11-30 NOTE — ED Provider Notes (Signed)
Emergency Medicine Provider Triage Evaluation Note  Derrick Murray , a 60 y.o. male  was evaluated in triage.  Pt complains of chest pain shortness of breath, history of A. fib anticoagulated on Eliquis.  Additionally had stents placed a few weeks ago.  Pain improved with nitro, seen by Dr. Doylene Canard and sent to the ED.  Review of Systems  Positive:  Palpitations, Chest pain, shortness of breath, Negative: Fevers, chills  Physical Exam  BP 115/68 (BP Location: Right Arm)    Pulse (!) 135    Temp 98.5 F (36.9 C) (Oral)    Resp 16    SpO2 100%  Gen:   Awake, no distress   Resp:  Normal effort  MSK:   Moves extremities without difficulty  Other:  Irregularly irregular tachycardic rhythm.  Lungs CTA B  Medical Decision Making  Medically screening exam initiated at 2:01 PM.  Appropriate orders placed.  SAIR FAULCON was informed that the remainder of the evaluation will be completed by another provider, this initial triage assessment does not replace that evaluation, and the importance of remaining in the ED until their evaluation is complete.  EKG with A. fib with RVR, ED attending alert, tired earlier.  Patient to be prioritized for room.  Work-up started.  This chart was dictated using voice recognition software, Dragon. Despite the best efforts of this provider to proofread and correct errors, errors may still occur which can change documentation meaning.    Emeline Darling, PA-C 40/98/11 [id number]    Lianne Cure, DO 82/95/62 [id number]

## 2021-11-30 NOTE — ED Triage Notes (Signed)
To ED for eval of cp and sob that started this am after getting up. Stent placed 2 wks ago. Went to cardiologist office and after ECG done pt sent to ED POV. 6/10 pain in chest - described as tightness.

## 2021-11-30 NOTE — ED Notes (Signed)
Pt called out and requesting ambien to help his rest tonight. MD paged.

## 2021-11-30 NOTE — ED Provider Notes (Signed)
[facility] EMERGENCY DEPARTMENT Provider Note   CSN: [id number] Arrival date & time: 11/30/21  1256     History Chief Complaint  Patient presents with   Chest Pain    Derrick Murray is a 60 y.o. male.  Patient sent in from Dr. Merrilee Jansky office.  He was trying to do direct admission but there was no beds available.  So patient sent to the ED.  Patient with pressure and substernal part of the anterior chest starting yesterday afternoon but this morning it was much more severe.  Patient took 1 nitroglycerin which improved the pain from a 6 to a 4 and is remained as a pressure at 4.  Patient had a stent placed during admission from December 12 to December 13.  Patient is known to have a history of diabetes coronary artery disease atrial fibrillation and hypertension.  Patient first arrived here heart rate was like 129 atrial fibrillation.  But now currently its in the 80s.  Still atrial fibrillation.  Discussed with Dr. Doylene Canard he is planning on admitting him.  Since he is on Eliquis does not want to do heparin at this time.  And he is knowledgeable that he still has some persistent chest pain at 4.  Initial troponin was normal.      Past Medical History:  Diagnosis Date   Anxiety    Arthritis    Coronary artery disease    Diabetes mellitus without complication ([place])    type2   GERD (gastroesophageal reflux disease)    Hyperlipidemia    Hypertension    Prostate cancer ([place])    Wears glasses    Wears partial dentures    lower    Patient Active Problem List   Diagnosis Date Noted   Acute coronary syndrome ([place]) 11/14/2021   Nocturia 12/24/2020   Prostate cancer ([place]) 10/21/2020   Elevated PSA 08/30/2020   Essential hypertension 06/30/2020   Mixed hyperlipidemia 06/30/2020   Obesity 06/30/2020   Polyneuropathy in diabetes ([place]) 06/30/2020   Type 2 diabetes mellitus with diabetic chronic kidney disease ([place]) 06/30/2020    Past Surgical History:   Procedure Laterality Date   CARDIOVERSION N/A 06/28/2021   Procedure: CARDIOVERSION;  Surgeon: Dixie Dials, MD;  Location: [place];  Service: Cardiovascular;  Laterality: N/A;   CORONARY STENT INTERVENTION N/A 11/15/2021   Procedure: CORONARY STENT INTERVENTION;  Surgeon: Nigel Mormon, MD;  Location: [place] [facility];  Service: Cardiovascular;  Laterality: N/A;   GOLD SEED IMPLANT N/A 01/10/2021   Procedure: GOLD SEED IMPLANT;  Surgeon: Cleon Gustin, MD;  Location: [facility];  Service: Urology;  Laterality: N/A;   HERNIA REPAIR  yrs ago   umbilical hernia   LEFT HEART CATH AND CORONARY ANGIOGRAPHY N/A 11/15/2021   Procedure: LEFT HEART CATH AND CORONARY ANGIOGRAPHY;  Surgeon: Dixie Dials, MD;  Location: [place] [facility];  Service: Cardiovascular;  Laterality: N/A;   PROSTATE BIOPSY  11/2020   SPACE OAR INSTILLATION N/A 01/10/2021   Procedure: SPACE OAR INSTILLATION;  Surgeon: Cleon Gustin, MD;  Location: [facility];  Service: Urology;  Laterality: N/A;   TENDON REPAIR Left early 2021   left arm       Family History  Problem Relation Age of Onset   Colon cancer Maternal Grandfather    Heart attack Maternal Grandfather    Breast cancer Neg Hx    Prostate cancer Neg Hx    Pancreatic cancer Neg Hx  Social History   Tobacco Use   Smoking status: Never   Smokeless tobacco: Former    Types: Chew    Quit date: 12/04/1989  Vaping Use   Vaping Use: Never used  Substance Use Topics   Alcohol use: Yes    Comment: 1- 2 beers a week   Drug use: No    Home Medications Prior to Admission medications   Medication Sig Start Date End Date Taking? Authorizing Provider  alfuzosin (UROXATRAL) 10 MG 24 hr tablet Take 1 tablet (10 mg total) by mouth in the morning and at bedtime. 06/15/21  Yes McKenzie, Candee Furbish, MD  amLODipine-benazepril (LOTREL) 10-20 MG per capsule Take 1 capsule by mouth in the morning. 07/05/15  Yes  [provider]  apixaban (ELIQUIS) 5 MG TABS tablet Take 5 mg by mouth 2 (two) times daily.   Yes [provider]  aspirin 81 MG EC tablet Take 1 tablet (81 mg total) by mouth daily. Swallow whole. Stop medication after 30 days. Continue Eliquis and Clopidogrel. 11/16/21  Yes Dixie Dials, MD  atorvastatin (LIPITOR) 40 MG tablet Take 40 mg by mouth in the morning. 05/24/15  Yes [provider]  clopidogrel (PLAVIX) 75 MG tablet Take 1 tablet (75 mg total) by mouth daily. 11/16/21 11/16/22 Yes Dixie Dials, MD  FLUoxetine (PROZAC) 40 MG capsule Take 40 mg by mouth in the morning. 07/05/15  Yes [provider]  JARDIANCE 25 MG TABS tablet Take 25 mg by mouth in the morning. 10/08/20  Yes [provider]  metFORMIN (GLUCOPHAGE) 1000 MG tablet Take 1 tablet (1,000 mg total) by mouth 2 (two) times daily. Start from Friday-11/18/2021 11/16/21  Yes Dixie Dials, MD  metoprolol succinate (TOPROL-XL) 50 MG 24 hr tablet Take 50 mg by mouth daily. 06/23/21  Yes [provider]  nitroGLYCERIN (NITROSTAT) 0.4 MG SL tablet Place 1 tablet (0.4 mg total) under the tongue every 5 (five) minutes x 3 doses as needed for chest pain. 11/16/21  Yes Dixie Dials, MD  pantoprazole (PROTONIX) 40 MG tablet Take 1 tablet (40 mg total) by mouth daily. 11/16/21  Yes Dixie Dials, MD  sucralfate (CARAFATE) 1 g tablet Take 1 g by mouth 4 (four) times daily. 11/17/21  Yes [provider]  TRULICITY 0.10 XN/2.3FT SOPN Inject 0.5 mg into the skin once a week. 11/27/21  Yes [provider]  mupirocin ointment (BACTROBAN) 2 % Place 1 application into the nose 2 (two) times daily. Patient not taking: Reported on 11/30/2021 11/16/21   Dixie Dials, MD    Allergies    Penicillins  Review of Systems   Review of Systems  Constitutional:  Positive for fatigue. Negative for chills and fever.  HENT:  Negative for ear pain and sore throat.   Eyes:  Negative for  pain and visual disturbance.  Respiratory:  Negative for cough and shortness of breath.   Cardiovascular:  Positive for chest pain and palpitations.  Gastrointestinal:  Negative for abdominal pain and vomiting.  Genitourinary:  Negative for dysuria and hematuria.  Musculoskeletal:  Negative for arthralgias and back pain.  Skin:  Negative for color change and rash.  Neurological:  Negative for seizures and syncope.  All other systems reviewed and are negative.  Physical Exam Updated Vital Signs BP (!) 139/97    Pulse 87    Temp 98.5 F (36.9 C) (Oral)    Resp 12    SpO2 100%   Physical Exam Vitals and nursing note reviewed.  Constitutional:      General: He is not in acute distress.    Appearance: Normal appearance. He is well-developed.  HENT:     Head: Normocephalic and atraumatic.  Eyes:     Extraocular Movements: Extraocular movements intact.     Conjunctiva/sclera: Conjunctivae normal.     Pupils: Pupils are equal, round, and reactive to light.  Cardiovascular:     Rate and Rhythm: Rhythm irregular.     Heart sounds: No murmur heard. Pulmonary:     Effort: Pulmonary effort is normal. No respiratory distress.     Breath sounds: Normal breath sounds.  Chest:     Chest wall: No tenderness.  Abdominal:     Palpations: Abdomen is soft.     Tenderness: There is no abdominal tenderness.  Musculoskeletal:        General: No swelling.     Cervical back: Normal range of motion and neck supple.  Skin:    General: Skin is warm and dry.     Capillary Refill: Capillary refill takes less than 2 seconds.  Neurological:     General: No focal deficit present.     Mental Status: He is alert and oriented to person, place, and time.  Psychiatric:        Mood and Affect: Mood normal.    ED Results / Procedures / Treatments   Labs (all labs ordered are listed, but only abnormal results are displayed) Labs Reviewed  BASIC METABOLIC PANEL - Abnormal; Notable for the following  components:      Result Value   Sodium 134 (*)    CO2 19 (*)    Glucose, Bld 145 (*)    BUN 26 (*)    All other components within normal limits  PROTIME-INR - Abnormal; Notable for the following components:   Prothrombin Time 15.3 (*)    All other components within normal limits  RESP PANEL BY RT-PCR (FLU A&B, COVID) ARPGX2  CBC  MAGNESIUM  BASIC METABOLIC PANEL  LIPID PANEL  CBC  PROTIME-INR  TROPONIN I (HIGH SENSITIVITY)  TROPONIN I (HIGH SENSITIVITY)    EKG EKG Interpretation  Date/Time:  Wednesday November 30 2021 13:48:29 EST Ventricular Rate:  129 PR Interval:    QRS Duration: 78 QT Interval:  318 QTC Calculation: 465 R Axis:   27 Text Interpretation: Atrial fibrillation with rapid ventricular response Cannot rule out Anterior infarct , age undetermined Abnormal ECG When compared with ECG of 16-Nov-2021 03:40, PREVIOUS ECG IS PRESENT Confirmed by Fredia Sorrow [phone]) on 11/30/2021 3:20:51 PM  Radiology DG Chest 2 View  Result Date: 11/30/2021 CLINICAL DATA:  Chest pain. EXAM: CHEST - 2 VIEW COMPARISON:  Chest x-ray dated October 14, 2021. FINDINGS: The heart size and mediastinal contours are within normal limits. Both lungs are clear. The visualized skeletal structures are unremarkable. IMPRESSION: No active cardiopulmonary disease. Electronically Signed   By: Titus Dubin M.D.   On: 11/30/2021 15:27    Procedures Procedures   Medications Ordered in ED Medications  aspirin EC tablet 81 mg (has no administration in time range)  nitroGLYCERIN (NITROSTAT) SL tablet 0.4 mg (has no administration in time range)  acetaminophen (TYLENOL) tablet 650 mg (has no administration in time range)  ondansetron (ZOFRAN) injection 4 mg (has no administration in time range)  0.9 %  sodium chloride infusion (has no administration in time range)  alfuzosin (UROXATRAL) 24 hr tablet 10 mg (has no administration in time range)  atorvastatin (LIPITOR) tablet 40 mg (has  no  administration in time range)  clopidogrel (PLAVIX) tablet 75 mg (has no administration in time range)  FLUoxetine (PROZAC) capsule 40 mg (has no administration in time range)  empagliflozin (JARDIANCE) tablet 25 mg (has no administration in time range)  metoprolol succinate (TOPROL-XL) 24 hr tablet 50 mg (has no administration in time range)  pantoprazole (PROTONIX) EC tablet 40 mg (has no administration in time range)  sucralfate (CARAFATE) tablet 1 g (has no administration in time range)    ED Course  I have reviewed the triage vital signs and the nursing notes.  Pertinent labs & imaging results that were available during my care of the patient were reviewed by me and considered in my medical decision making (see chart for details).    MDM Rules/Calculators/A&P                         Patient with atrial fibrillation is currently rate controlled.  Patient with persistent chest pressure.  Initial troponin reassuring.  Delta troponin is pending.  Chest x-ray without any acute findings.  EKG without any acute changes.  But is showing atrial fibrillation.  With a rate of 129.  However now his heart rate without any intervention is in the 80s.  Patient is on Eliquis and patient is on beta-blocker.  Dr. Doylene Canard will admit.  Is aware he still has chest pressure at 4 and also since he is on Eliquis does not want to start heparin.   Final Clinical Impression(s) / ED Diagnoses Final diagnoses:  Precordial pain  Chronic atrial fibrillation [facility])    Rx / DC Orders ED Discharge Orders          Ordered    Amb referral to AFIB Clinic        11/30/21 1358             Fredia Sorrow, MD 11/30/21 1709

## 2021-11-30 NOTE — ED Notes (Signed)
Patient transported to X-ray 

## 2021-11-30 NOTE — ED Notes (Signed)
Informed consent signed and witnessed at bedside.

## 2021-12-01 ENCOUNTER — Inpatient Hospital Stay (HOSPITAL_COMMUNITY)
Admission: EM | Disposition: A | Discharge status: Home / Self Care | Payer: Self-pay | Source: Home / Self Care | Attending: Cardiovascular Disease

## 2021-12-01 ENCOUNTER — Other Ambulatory Visit: Payer: Self-pay

## 2021-12-01 HISTORY — PX: LEFT HEART CATH AND CORONARY ANGIOGRAPHY: CATH118249

## 2021-12-01 LAB — LIPID PANEL
Cholesterol: 182 mg/dL (ref 0–200)
HDL: 41 mg/dL (ref >40)
LDL Cholesterol: 65 mg/dL (ref 0–99)
Total CHOL/HDL Ratio: 4.4 RATIO
Triglycerides: 381 mg/dL — ABNORMAL HIGH (ref <150)
VLDL: 76 mg/dL — ABNORMAL HIGH (ref 0–40)

## 2021-12-01 LAB — GLUCOSE, CAPILLARY: Glucose-Capillary: 129 mg/dL — ABNORMAL HIGH (ref 70–99)

## 2021-12-01 LAB — BASIC METABOLIC PANEL
Anion gap: 9 (ref 5–15)
BUN: 21 mg/dL (ref 8–23)
CO2: 23 mmol/L (ref 22–32)
Calcium: 9.1 mg/dL (ref 8.9–10.3)
Chloride: 105 mmol/L (ref 98–111)
Creatinine, Ser: 1.11 mg/dL (ref 0.61–1.24)
GFR, Estimated: >60 mL/min (ref >60)
Glucose, Bld: 128 mg/dL — ABNORMAL HIGH (ref 70–99)
Potassium: 3.8 mmol/L (ref 3.5–5.1)
Sodium: 137 mmol/L (ref 135–145)

## 2021-12-01 LAB — CBC
HCT: 45.6 % (ref 39.0–52.0)
Hemoglobin: 15.7 g/dL (ref 13.0–17.0)
MCH: 32.6 pg (ref 26.0–34.0)
MCHC: 34.4 g/dL (ref 30.0–36.0)
MCV: 94.6 fL (ref 80.0–100.0)
Platelets: 196 10*3/uL (ref 150–400)
RBC: 4.82 MIL/uL (ref 4.22–5.81)
RDW: 13.4 % (ref 11.5–15.5)
WBC: 5.7 10*3/uL (ref 4.0–10.5)
nRBC: 0 % (ref 0.0–0.2)

## 2021-12-01 LAB — PROTIME-INR
INR: 1.1 (ref 0.8–1.2)
Prothrombin Time: 14 seconds (ref 11.4–15.2)

## 2021-12-01 SURGERY — LEFT HEART CATH AND CORONARY ANGIOGRAPHY
Anesthesia: LOCAL

## 2021-12-01 MED ORDER — IOHEXOL 350 MG/ML SOLN
INTRAVENOUS | Status: DC | PRN
  Administered 2021-12-01: 11:00:00 60 mL

## 2021-12-01 MED ORDER — LIDOCAINE HCL (PF) 1 % IJ SOLN
INTRAMUSCULAR | Status: AC
  Filled 2021-12-01: qty 30

## 2021-12-01 MED ORDER — HYDRALAZINE HCL 20 MG/ML IJ SOLN: 10.0000 mg | INTRAMUSCULAR | Status: AC | PRN

## 2021-12-01 MED ORDER — SODIUM CHLORIDE 0.9% FLUSH: 3.0000 mL | INTRAVENOUS | Status: DC | PRN

## 2021-12-01 MED ORDER — HEPARIN (PORCINE) IN NACL 1000-0.9 UT/500ML-% IV SOLN
INTRAVENOUS | Status: AC
  Filled 2021-12-01: qty 1000

## 2021-12-01 MED ORDER — MIDAZOLAM HCL 2 MG/2ML IJ SOLN
INTRAMUSCULAR | Status: DC | PRN
  Administered 2021-12-01: 1 mg via INTRAVENOUS

## 2021-12-01 MED ORDER — DILTIAZEM HCL 60 MG PO TABS
60.0000 mg | ORAL_TABLET | Freq: Four times a day (QID) | ORAL | Status: DC
  Administered 2021-12-01 – 2021-12-02 (×4): 60 mg via ORAL
  Filled 2021-12-01 (×3): qty 1

## 2021-12-01 MED ORDER — FENTANYL CITRATE (PF) 100 MCG/2ML IJ SOLN
INTRAMUSCULAR | Status: DC | PRN
  Administered 2021-12-01: 25 ug via INTRAVENOUS

## 2021-12-01 MED ORDER — FENTANYL CITRATE (PF) 100 MCG/2ML IJ SOLN
INTRAMUSCULAR | Status: AC
  Filled 2021-12-01: qty 2

## 2021-12-01 MED ORDER — ALPRAZOLAM 0.5 MG PO TABS
0.5000 mg | ORAL_TABLET | Freq: Every evening | ORAL | Status: DC | PRN
  Administered 2021-12-01: 21:00:00 0.5 mg via ORAL
  Filled 2021-12-01: qty 1

## 2021-12-01 MED ORDER — DIGOXIN 0.25 MG/ML IJ SOLN
0.2500 mg | Freq: Once | INTRAMUSCULAR | Status: AC
  Administered 2021-12-01: 17:00:00 0.25 mg via INTRAVENOUS
  Filled 2021-12-01: qty 2

## 2021-12-01 MED ORDER — SODIUM CHLORIDE 0.9% FLUSH
3.0000 mL | Freq: Two times a day (BID) | INTRAVENOUS | Status: DC
  Administered 2021-12-01 – 2021-12-02 (×2): 3 mL via INTRAVENOUS

## 2021-12-01 MED ORDER — LABETALOL HCL 5 MG/ML IV SOLN
INTRAVENOUS | Status: DC | PRN
  Administered 2021-12-01 (×2): 10 mg via INTRAVENOUS

## 2021-12-01 MED ORDER — SODIUM CHLORIDE 0.9 % IV SOLN: 250.0000 mL | INTRAVENOUS | Status: DC | PRN

## 2021-12-01 MED ORDER — METOPROLOL SUCCINATE ER 100 MG PO TB24
100.0000 mg | ORAL_TABLET | Freq: Every day | ORAL | Status: DC
  Administered 2021-12-01 – 2021-12-02 (×2): 100 mg via ORAL
  Filled 2021-12-01 (×2): qty 1

## 2021-12-01 MED ORDER — HEPARIN (PORCINE) IN NACL 1000-0.9 UT/500ML-% IV SOLN
INTRAVENOUS | Status: DC | PRN
  Administered 2021-12-01 (×2): 500 mL

## 2021-12-01 MED ORDER — MIDAZOLAM HCL 2 MG/2ML IJ SOLN
INTRAMUSCULAR | Status: AC
  Filled 2021-12-01: qty 2

## 2021-12-01 MED ORDER — APIXABAN 5 MG PO TABS
5.0000 mg | ORAL_TABLET | Freq: Two times a day (BID) | ORAL | Status: DC
  Administered 2021-12-01 – 2021-12-02 (×2): 5 mg via ORAL
  Filled 2021-12-01 (×2): qty 1

## 2021-12-01 MED ORDER — LIDOCAINE HCL (PF) 1 % IJ SOLN
INTRAMUSCULAR | Status: DC | PRN
  Administered 2021-12-01: 10 mL

## 2021-12-01 MED ORDER — SODIUM CHLORIDE 0.9 % IV SOLN: INTRAVENOUS | Status: AC

## 2021-12-01 MED ORDER — LABETALOL HCL 5 MG/ML IV SOLN
INTRAVENOUS | Status: AC
  Filled 2021-12-01: qty 4

## 2021-12-01 MED ORDER — LABETALOL HCL 5 MG/ML IV SOLN: 10.0000 mg | INTRAVENOUS | Status: AC | PRN

## 2021-12-01 SURGICAL SUPPLY — 11 items
CATH INFINITI 5FR MULTPACK ANG (CATHETERS) ×2 IMPLANT
ELECT DEFIB PAD ADLT CADENCE (PAD) ×2 IMPLANT
KIT HEART LEFT (KITS) ×4 IMPLANT
NDL SMART REG 18GX2-3/4 (NEEDLE) IMPLANT
NEEDLE SMART REG 18GX2-3/4 (NEEDLE) IMPLANT
PACK CARDIAC CATHETERIZATION (CUSTOM PROCEDURE TRAY) ×4 IMPLANT
SHEATH PINNACLE 5F 10CM (SHEATH) ×2 IMPLANT
SHEATH PROBE COVER 6X72 (BAG) ×2 IMPLANT
SYR MEDRAD MARK 7 150ML (SYRINGE) ×4 IMPLANT
TRANSDUCER W/STOPCOCK (MISCELLANEOUS) ×4 IMPLANT
WIRE EMERALD 3MM-J .035X150CM (WIRE) ×2 IMPLANT

## 2021-12-01 NOTE — Progress Notes (Signed)
SITE AREA: right groin/femoral area  SITE PRIOR TO REMOVAL:  LEVEL 0   PRESSURE APPLIED FOR: approximately 20 minutes  MANUAL: yes  PATIENT STATUS DURING PULL: stable  POST PULL SITE:  LEVEL 0  POST PULL INSTRUCTIONS GIVEN: yes  POST PULL PULSES PRESENT: bilateral pedal pulses at  +2  DRESSING APPLIED: gauze with tegaderm  BEDREST BEGINS @ 1150   COMMENTS:

## 2021-12-01 NOTE — Interval H&P Note (Signed)
History and Physical Interval Note:  12/01/2021 9:51 AM  Derrick Murray  has presented today for surgery, with the diagnosis of acs.  The various methods of treatment have been discussed with the patient and family. After consideration of risks, benefits and other options for treatment, the patient has consented to  Procedure(s): LEFT HEART CATH AND CORONARY ANGIOGRAPHY (N/A) as a surgical intervention.  The patient's history has been reviewed, patient examined, no change in status, stable for surgery.  I have reviewed the patient's chart and labs.  Questions were answered to the patient's satisfaction.     Birdie Riddle

## 2021-12-01 NOTE — Plan of Care (Incomplete)
  Problem: Health Behavior/Discharge Planning: Goal: Ability to manage health-related needs will improve Outcome: Progressing   Problem: Clinical Measurements: Goal: Ability to maintain clinical measurements within normal limits will improve Outcome: Progressing   Problem: Education: Goal: Knowledge of General Education information will improve Description: Including pain rating scale, medication(s)/side effects and non-pharmacologic comfort measures Outcome: Progressing   

## 2021-12-02 ENCOUNTER — Inpatient Hospital Stay (HOSPITAL_COMMUNITY): Payer: BC Managed Care – PPO | Admitting: Certified Registered Nurse Anesthetist

## 2021-12-02 ENCOUNTER — Encounter (HOSPITAL_COMMUNITY)
Admission: EM | Disposition: A | Discharge status: Home / Self Care | Payer: Self-pay | Source: Home / Self Care | Attending: Cardiovascular Disease

## 2021-12-02 ENCOUNTER — Encounter (HOSPITAL_COMMUNITY): Payer: Self-pay | Admitting: Cardiovascular Disease

## 2021-12-02 HISTORY — PX: CARDIOVERSION: SHX1299

## 2021-12-02 LAB — CBC
HCT: 46 % (ref 39.0–52.0)
Hemoglobin: 15.7 g/dL (ref 13.0–17.0)
MCH: 32.8 pg (ref 26.0–34.0)
MCHC: 34.1 g/dL (ref 30.0–36.0)
MCV: 96 fL (ref 80.0–100.0)
Platelets: 187 10*3/uL (ref 150–400)
RBC: 4.79 MIL/uL (ref 4.22–5.81)
RDW: 13.3 % (ref 11.5–15.5)
WBC: 6.1 10*3/uL (ref 4.0–10.5)
nRBC: 0 % (ref 0.0–0.2)

## 2021-12-02 LAB — BASIC METABOLIC PANEL
Anion gap: 11 (ref 5–15)
BUN: 21 mg/dL (ref 8–23)
CO2: 17 mmol/L — ABNORMAL LOW (ref 22–32)
Calcium: 8.8 mg/dL — ABNORMAL LOW (ref 8.9–10.3)
Chloride: 107 mmol/L (ref 98–111)
Creatinine, Ser: 1.01 mg/dL (ref 0.61–1.24)
GFR, Estimated: >60 mL/min (ref >60)
Glucose, Bld: 145 mg/dL — ABNORMAL HIGH (ref 70–99)
Potassium: 4.2 mmol/L (ref 3.5–5.1)
Sodium: 135 mmol/L (ref 135–145)

## 2021-12-02 SURGERY — CARDIOVERSION
Anesthesia: General

## 2021-12-02 MED ORDER — METFORMIN HCL 1000 MG PO TABS
1000.0000 mg | ORAL_TABLET | Freq: Two times a day (BID) | ORAL | Status: AC
Start: 2021-12-02 — End: ?

## 2021-12-02 MED ORDER — BENAZEPRIL HCL 20 MG PO TABS
20.0000 mg | ORAL_TABLET | Freq: Every day | ORAL | 3 refills | Status: AC
Start: 2021-12-03 — End: ?

## 2021-12-02 MED ORDER — ALPRAZOLAM 0.25 MG PO TABS: 0.2500 mg | ORAL_TABLET | Freq: Every day | ORAL | 0 refills | Status: AC

## 2021-12-02 MED ORDER — ALPRAZOLAM 0.25 MG PO TABS: 0.2500 mg | ORAL_TABLET | Freq: Every day | ORAL | Status: DC

## 2021-12-02 MED ORDER — BENAZEPRIL HCL 20 MG PO TABS: 20.0000 mg | ORAL_TABLET | Freq: Every day | ORAL | Status: DC

## 2021-12-02 MED ORDER — PROPOFOL 10 MG/ML IV BOLUS
INTRAVENOUS | Status: DC | PRN
  Administered 2021-12-02: 60 mg via INTRAVENOUS

## 2021-12-02 MED ORDER — DILTIAZEM HCL ER COATED BEADS 120 MG PO CP24
120.0000 mg | ORAL_CAPSULE | Freq: Every day | ORAL | 3 refills | Status: AC
Start: 2021-12-02 — End: ?

## 2021-12-02 MED ORDER — DILTIAZEM HCL ER COATED BEADS 120 MG PO CP24: 120.0000 mg | ORAL_CAPSULE | Freq: Every day | ORAL | Status: DC

## 2021-12-02 MED ORDER — LIDOCAINE 2% (20 MG/ML) 5 ML SYRINGE
INTRAMUSCULAR | Status: DC | PRN
Start: 2021-12-02 — End: 2021-12-02
  Administered 2021-12-02: 40 mg via INTRAVENOUS

## 2021-12-02 MED FILL — Labetalol HCl IV Soln Prefilled Syringe 20 MG/4ML (5 MG/ML): INTRAVENOUS | Qty: 4 | Status: AC

## 2021-12-02 NOTE — Anesthesia Postprocedure Evaluation (Signed)
Anesthesia Post Note  Patient: ERROL ALA  Procedure(s) Performed: CARDIOVERSION     Patient location during evaluation: PACU Anesthesia Type: General Level of consciousness: awake and alert Pain management: pain level controlled Vital Signs Assessment: post-procedure vital signs reviewed and stable Respiratory status: spontaneous breathing, nonlabored ventilation, respiratory function stable and patient connected to nasal cannula oxygen Cardiovascular status: blood pressure returned to baseline and stable Postop Assessment: no apparent nausea or vomiting Anesthetic complications: no   No notable events documented.  Last Vitals:  Vitals:   12/02/21 0840 12/02/21 0909  BP: 117/86 112/76  Pulse: 65 69  Resp: 15 18  Temp:  36.5 C  SpO2: 98% 98%    Last Pain:  Vitals:   12/02/21 0909  TempSrc: Oral  PainSc:                  Effie Berkshire

## 2021-12-02 NOTE — Transfer of Care (Signed)
Immediate Anesthesia Transfer of Care Note  Patient: Derrick Murray  Procedure(s) Performed: CARDIOVERSION  Patient Location: Endoscopy Unit  Anesthesia Type:General  Level of Consciousness: drowsy  Airway & Oxygen Therapy: Patient Spontanous Breathing  Post-op Assessment: Report given to RN and Post -op Vital signs reviewed and stable  Post vital signs: Reviewed and stable  Last Vitals:  Vitals Value Taken Time  BP 103/72 0820 12/02/21  Temp    Pulse 71 0820 12/02/21  Resp 14 0820 12/02/21  SpO2 96 0820 12/02/21    Last Pain:  Vitals:   12/02/21 0750  TempSrc: Temporal  PainSc: 0-No pain         Complications: No notable events documented.

## 2021-12-02 NOTE — Anesthesia Procedure Notes (Signed)
Procedure Name: General with mask airway Date/Time: 12/02/2021 8:16 AM Performed by: Colin Benton, CRNA Pre-anesthesia Checklist: Patient identified, Emergency Drugs available, Suction available and Patient being monitored Patient Re-evaluated:Patient Re-evaluated prior to induction Oxygen Delivery Method: Ambu bag Preoxygenation: Pre-oxygenation with 100% oxygen Induction Type: IV induction Placement Confirmation: positive ETCO2 Dental Injury: Teeth and Oropharynx as per pre-operative assessment

## 2021-12-02 NOTE — Progress Notes (Signed)
Ref: Kendrick Ranch, MD   Subjective:  Sedated post cardioversion. Patient had recurrent atrial flutter with RVR with dizziness and chest pain. His chest pain responded to SL NTG. He had known moderate to severe RCA lesion and LAD drug eluting stent on 11/15/2021. Hence he underwent cardiac cath with coronary angiography yesterday showing patent LAD stent and stable RCA disease. Coronary angiogram was reviewed with Dr. Angelena Form of [facility] interventionalist and medical therapy was recommended. Today he had successful external cardioversion using deep sedation and 120 J biphasic shock.  Objective:  Vital Signs in the last 24 hours: Temp:  [97 F (36.1 C)-98.6 F (37 C)] 97 F (36.1 C) (12/30 0750) Pulse Rate:  [30-149] 70 (12/30 0823) Cardiac Rhythm: Atrial flutter (12/29 1905) Resp:  [0-45] 20 (12/30 0823) BP: (107-146)/(66-94) 107/76 (12/30 0823) SpO2:  [97 %-100 %] 97 % (12/30 0823) Weight:  [109.9 kg] 109.9 kg (12/30 0750)  Physical Exam: BP Readings from Last 1 Encounters:  12/02/21 107/76     Wt Readings from Last 1 Encounters:  12/02/21 109.9 kg    Weight change:  Body mass index is 31.97 kg/m. HEENT: [place]/AT, Eyes-Blue, Conjunctiva-Pink, Sclera-Non-icteric Neck: No JVD, No bruit, Trachea midline. Lungs:  Clear, Bilateral. Cardiac:  Regular rhythm, normal S1 and S2, no S3. II/VI systolic murmur. Abdomen:  Soft, non-tender. BS present. Extremities:  No edema present. No cyanosis. No clubbing. CNS: AxOx3, Cranial nerves grossly intact, moves all 4 extremities.  Skin: Warm and dry.   Intake/Output from previous day: 12/29 0701 - 12/30 0700 In: 1550.4 [P.O.:120; I.V.:1430.4] Out: 2325 [Urine:2325]    Lab Results: BMET    Component Value Date/Time   NA 135 12/02/2021 0325   NA 137 12/01/2021 0345   NA 134 (L) 11/30/2021 1403   NA 140 10/21/2020 1627   K 4.2 12/02/2021 0325   K 3.8 12/01/2021 0345   K 3.9 11/30/2021 1403   CL 107 12/02/2021 0325   CL  105 12/01/2021 0345   CL 103 11/30/2021 1403   CO2 17 (L) 12/02/2021 0325   CO2 23 12/01/2021 0345   CO2 19 (L) 11/30/2021 1403   GLUCOSE 145 (H) 12/02/2021 0325   GLUCOSE 128 (H) 12/01/2021 0345   GLUCOSE 145 (H) 11/30/2021 1403   BUN 21 12/02/2021 0325   BUN 21 12/01/2021 0345   BUN 26 (H) 11/30/2021 1403   BUN 21 10/21/2020 1627   CREATININE 1.01 12/02/2021 0325   CREATININE 1.11 12/01/2021 0345   CREATININE 1.23 11/30/2021 1403   CALCIUM 8.8 (L) 12/02/2021 0325   CALCIUM 9.1 12/01/2021 0345   CALCIUM 9.2 11/30/2021 1403   GFRNONAA >60 12/02/2021 0325   GFRNONAA >60 12/01/2021 0345   GFRNONAA >60 11/30/2021 1403   GFRAA 75 10/21/2020 1627   CBC    Component Value Date/Time   WBC 6.1 12/02/2021 0325   RBC 4.79 12/02/2021 0325   HGB 15.7 12/02/2021 0325   HCT 46.0 12/02/2021 0325   PLT 187 12/02/2021 0325   MCV 96.0 12/02/2021 0325   MCH 32.8 12/02/2021 0325   MCHC 34.1 12/02/2021 0325   RDW 13.3 12/02/2021 0325   LYMPHSABS 0.9 11/14/2021 1907   MONOABS 0.5 11/14/2021 1907   EOSABS 0.1 11/14/2021 1907   BASOSABS 0.0 11/14/2021 1907   HEPATIC Function Panel Recent Labs    11/14/21 1907  PROT 6.6   HEMOGLOBIN A1C No components found for: HGA1C,  MPG CARDIAC ENZYMES No results found for: CKTOTAL, CKMB, CKMBINDEX, TROPONINI BNP No results  for input(s): PROBNP in the last 8760 hours. TSH No results for input(s): TSH in the last 8760 hours. CHOLESTEROL Recent Labs    11/15/21 0304 12/01/21 0345  CHOL 133 182    Scheduled Meds:  [MAR Hold] alfuzosin  10 mg Oral BID   [MAR Hold] apixaban  5 mg Oral BID   [MAR Hold] aspirin EC  81 mg Oral Daily   [MAR Hold] atorvastatin  40 mg Oral q AM   [MAR Hold] clopidogrel  75 mg Oral Daily   [MAR Hold] diltiazem  60 mg Oral QID   [MAR Hold] empagliflozin  25 mg Oral Daily   [MAR Hold] FLUoxetine  40 mg Oral q AM   [MAR Hold] metoprolol succinate  100 mg Oral Daily   [MAR Hold] pantoprazole  40 mg Oral Daily    [MAR Hold] sodium chloride flush  3 mL Intravenous Q12H   [MAR Hold] sucralfate  1 g Oral QID   Continuous Infusions:  sodium chloride 75 mL/hr at 12/02/21 0813   [MAR Hold] sodium chloride     PRN Meds:.[MAR Hold] sodium chloride, [MAR Hold] acetaminophen, [MAR Hold] ALPRAZolam, [MAR Hold] nitroGLYCERIN, [MAR Hold] ondansetron (ZOFRAN) IV, [MAR Hold] sodium chloride flush, [MAR Hold] zolpidem  Assessment/Plan:  Unstable angina Multivessel CAD Patent LAD stent Atrial flutter/fibrillation with RVR Successful external cardioversion for A. Fib, HTN HLD Obesity Anxiety  Plan: Monitor post external cardioversion. Home this afternoon if stable.   LOS: 2 days   Time spent including chart review, lab review, examination, discussion with patient : 30 min   Dixie Dials  MD  12/02/2021, 8:25 AM

## 2021-12-02 NOTE — Progress Notes (Signed)
Patient's heart rate sustaining 30-40 bpm with lowest rate 28bpm. Patient remains alert, oriented, and asymptomatic. Patient denies any distress. Doylene Canard MD notified. Will monitor

## 2021-12-02 NOTE — CV Procedure (Signed)
PRE-OP DIAGNOSIS:  Atrial fibrillation with RVR.  POST-OP DIAGNOSIS:  Sinus rhythm from atrial fibrillation.  OPERATOR:  Dixie Dials, MD.     ANESTHESIA:  Deep sedation with 40 mg. Lidocaine and 60 mg. Propofol by Everlean Cherry.  COMPLICATIONS:  None.   OPERATIVE TERM:  DC cardioversion.  The nature of the procedure, risks and alternatives were discussed with the patient who gave informed consent.  OPERATIVE TECHNIQUE:  The patient was sedated with 40 mg. Lidocaine and 60 mg. propofol.  When the patient was no longer responsive to quiet voice, DC cardioversion was performed with 120 J biphasically and synchronously.  Patient converted to sinus rhythm.  The patient was then monitored until fully alert and left the procedure area in stable condition.  IMPRESSION:  Successful DC cardioversion.

## 2021-12-02 NOTE — Anesthesia Preprocedure Evaluation (Addendum)
Anesthesia Evaluation  Patient identified by MRN, date of birth, ID band Patient awake    Reviewed: Allergy & Precautions, NPO status , Patient's Chart, lab work & pertinent test results  Airway Mallampati: III  TM Distance: >3 FB Neck ROM: Full    Dental  (+) Teeth Intact, Dental Advisory Given   Pulmonary neg pulmonary ROS,    breath sounds clear to auscultation       Cardiovascular hypertension, Pt. on medications and Pt. on home beta blockers + CAD   Rhythm:Irregular Rate:Normal     Neuro/Psych Anxiety  Neuromuscular disease    GI/Hepatic Neg liver ROS, GERD  Medicated,  Endo/Other  diabetes, Type 2, Oral Hypoglycemic Agents  Renal/GU Renal disease     Musculoskeletal  (+) Arthritis ,   Abdominal Normal abdominal exam  (+)   Peds  Hematology negative hematology ROS (+)   Anesthesia Other Findings   Reproductive/Obstetrics                            Anesthesia Physical Anesthesia Plan  ASA: 3  Anesthesia Plan: General   Post-op Pain Management:    Induction:   PONV Risk Score and Plan: 0  Airway Management Planned: Natural Airway and Simple Face Mask  Additional Equipment: None  Intra-op Plan:   Post-operative Plan:   Informed Consent: I have reviewed the patients History and Physical, chart, labs and discussed the procedure including the risks, benefits and alternatives for the proposed anesthesia with the patient or authorized representative who has indicated his/her understanding and acceptance.       Plan Discussed with: CRNA  Anesthesia Plan Comments:      Anesthesia Quick Evaluation

## 2021-12-02 NOTE — Progress Notes (Signed)
Patient discharging home. Vital signs stable at time of discharge as reflected in discharge summary. Discharge education given and verbal understanding returned. No questions at this time. Patient discharged with one printed prescription. Follow up appointment scheduled.

## 2021-12-02 NOTE — Discharge Summary (Signed)
Physician Discharge Summary  Patient ID: Derrick Murray MRN: [id number] DOB/AGE: [birth year] 60 y.o.  Admit date: 11/30/2021 Discharge date: 12/02/2021  Admission Diagnoses: Acute coronary syndrome Multivessel, native vessel CAD S/P LAD stent Atrial fibrillation with RVR HTN HLD Obesity   Discharge Diagnoses:  Principal Problem:   Acute coronary syndrome (HCC) Active problem:   Multivessel, native vessel CAD   Patent LAD stent   Atrial flutter/Fibrillation with RVR   Palpitation and dizziness   External cardioversion to normal sinus rhythm   HTN   HLD   Obesity   Anxiety  Discharged Condition: good  Hospital Course: 60 years old whit male with PMH of HTN, HLD, S/P Prostate cancer, atrial fibrillation on Eliquis and LAD stent on 11/15/2021 had recurrent palpitations, dizziness and chest pressure responding to SL NTG. His Troponin I level was normal. EKG showed atrial fibrillation with RVR. His cardiac cath showed patent drug eluting stent in proximal LAD and persistent diffuse mid to distal RCA disease. Medical therapy with continuing dual antiplatelets therapy and Eliquis for atrial fibrillation is recommended post review of films with interventional cardiologist. He may discontinue Aspirin in 2 weeks. He underwent external cardioversion with 120 J biphasic shock. He converted to Sinus rhythm. His amlodipine was changed to Diltiazem. His amlodipine was changed to Diltiazem. He may undergo catheter ablation if atrial fibrillation rhythm returns.  Xanax was added per patient and wife's request as patient has increased anxiety in last few months. He will see me in 1 week and Dr. Lunette Stands in 1 month.  Consults: cardiology  Significant Diagnostic Studies: labs: Normal CBC, BMET except blood sugar of 145 mg. Normal:D: cholesterol but elevated triglycerides to 381 mg. Normal Troponin I.  EKG: atrial flutter with RVR to sinus rhythm post external cardioversion.  Cardiac cath:  Patent LAD stent and stable diffuse Mid and distal RCA disease.  External cardioversion: Successful cardioversion to sinus rhythm  Treatments: cardiac meds: metoprolol, diltiazem, and Benazepril, ASA, Eliquis and Clopidogrel.  Discharge Exam: Blood pressure 112/76, pulse 69, temperature 97.7 F (36.5 C), temperature source Oral, resp. rate 18, height 6\' 1"  (1.854 m), weight 109.9 kg, SpO2 98 %. General appearance: alert, cooperative and appears stated age. Head: Normocephalic, atraumatic. Eyes: Blue eyes, pink conjunctiva, corneas clear.   Neck: No adenopathy, no carotid bruit, no JVD, supple, symmetrical, trachea midline and thyroid not enlarged. Resp: Clear to auscultation bilaterally. Cardio: Regular rate and rhythm, S1, S2 normal, II/VI systolic murmur, no click, rub or gallop. GI: Soft, non-tender; bowel sounds normal; no organomegaly. Extremities: No edema, cyanosis or clubbing. Right groin cath site is stable. Skin: Warm and dry.  Neurologic: Alert and oriented X 3, normal strength and tone. Normal coordination and gait.  Disposition: Discharge disposition: 01-Home or Self Care        Allergies as of 12/02/2021       Reactions   Penicillins Swelling, Rash        Medication List     STOP taking these medications    amLODipine-benazepril 10-20 MG capsule Commonly known as: LOTREL   mupirocin ointment 2 % Commonly known as: BACTROBAN       TAKE these medications    alfuzosin 10 MG 24 hr tablet Commonly known as: UROXATRAL Take 1 tablet (10 mg total) by mouth in the morning and at bedtime.   ALPRAZolam 0.25 MG tablet Commonly known as: XANAX Take 1 tablet (0.25 mg total) by mouth at bedtime.   apixaban 5 MG Tabs tablet Commonly known  asArne Cleveland Take 5 mg by mouth 2 (two) times daily.   aspirin 81 MG EC tablet Take 1 tablet (81 mg total) by mouth daily. Swallow whole. Stop medication after 30 days. Continue Eliquis and Clopidogrel.   atorvastatin  40 MG tablet Commonly known as: LIPITOR Take 40 mg by mouth in the morning.   benazepril 20 MG tablet Commonly known as: LOTENSIN Take 1 tablet (20 mg total) by mouth daily. Start taking on: December 03, 2021   clopidogrel 75 MG tablet Commonly known as: Plavix Take 1 tablet (75 mg total) by mouth daily.   diltiazem 120 MG 24 hr capsule Commonly known as: CARDIZEM CD Take 1 capsule (120 mg total) by mouth daily.   FLUoxetine 40 MG capsule Commonly known as: PROZAC Take 40 mg by mouth in the morning.   Jardiance 25 MG Tabs tablet Generic drug: empagliflozin Take 25 mg by mouth in the morning.   metFORMIN 1000 MG tablet Commonly known as: GLUCOPHAGE Take 1 tablet (1,000 mg total) by mouth 2 (two) times daily. Start from Saturday-12/03/2021 What changed: additional instructions   metoprolol succinate 50 MG 24 hr tablet Commonly known as: TOPROL-XL Take 50 mg by mouth daily.   nitroGLYCERIN 0.4 MG SL tablet Commonly known as: NITROSTAT Place 1 tablet (0.4 mg total) under the tongue every 5 (five) minutes x 3 doses as needed for chest pain.   pantoprazole 40 MG tablet Commonly known as: PROTONIX Take 1 tablet (40 mg total) by mouth daily.   sucralfate 1 g tablet Commonly known as: CARAFATE Take 1 g by mouth 4 (four) times daily.   Trulicity 1.19 ER/7.4YC Sopn Generic drug: Dulaglutide Inject 0.5 mg into the skin once a week.        Follow-up Information     Vasireddy, Lanetta Inch, MD Follow up in 1 month(s).   Specialty: Internal Medicine Contact information: [place] [address] [phone]         Dixie Dials, MD. Go on 12/08/2021.   Specialty: Cardiology Why: @3 :00pm Contact information: [place] [address] [phone]                 Time spent: Review of old chart, current chart, lab, x-ray, cardiac tests and discussion with patient over 60 minutes.  Signed: Birdie Riddle 12/02/2021,  1:00 PM

## 2021-12-04 ENCOUNTER — Encounter (HOSPITAL_COMMUNITY): Payer: Self-pay | Admitting: Cardiovascular Disease

## 2021-12-26 ENCOUNTER — Encounter: Payer: Self-pay | Admitting: Urology

## 2021-12-26 ENCOUNTER — Other Ambulatory Visit: Payer: Self-pay

## 2021-12-26 ENCOUNTER — Ambulatory Visit: Payer: BC Managed Care – PPO | Admitting: Urology

## 2021-12-26 VITALS — BP 112/74 | HR 75 | Ht 73.0 in | Wt 247.0 lb

## 2021-12-26 DIAGNOSIS — N138 Other obstructive and reflux uropathy: Secondary | ICD-10-CM | POA: Diagnosis not present

## 2021-12-26 DIAGNOSIS — C61 Malignant neoplasm of prostate: Secondary | ICD-10-CM

## 2021-12-26 DIAGNOSIS — R3912 Poor urinary stream: Secondary | ICD-10-CM

## 2021-12-26 DIAGNOSIS — N401 Enlarged prostate with lower urinary tract symptoms: Secondary | ICD-10-CM

## 2021-12-26 LAB — URINALYSIS, ROUTINE W REFLEX MICROSCOPIC
Bilirubin, UA: NEGATIVE
Ketones, UA: NEGATIVE
Leukocytes,UA: NEGATIVE
Nitrite, UA: NEGATIVE
Protein,UA: NEGATIVE
RBC, UA: NEGATIVE
Specific Gravity, UA: <1.005 — ABNORMAL LOW (ref 1.005–1.030)
Urobilinogen, Ur: 0.2 mg/dL (ref 0.2–1.0)
pH, UA: 5 (ref 5.0–7.5)

## 2021-12-26 MED ORDER — ALFUZOSIN HCL ER 10 MG PO TB24: 10.0000 mg | ORAL_TABLET | Freq: Two times a day (BID) | ORAL | 11 refills | Status: DC

## 2021-12-26 MED ORDER — LEUPROLIDE ACETATE (6 MONTH) 45 MG ~~LOC~~ KIT
45.0000 mg | PACK | Freq: Once | SUBCUTANEOUS | Status: AC
  Administered 2021-12-26: 45 mg via SUBCUTANEOUS

## 2021-12-26 NOTE — Patient Instructions (Signed)
Prostate Cancer °The prostate is a small gland that helps make semen. It is located below a man's bladder, in front of the rectum. Prostate cancer is when abnormal cells grow in this gland. °What are the causes? °The cause of this condition is not known. °What increases the risk? °Being age 62 or older. °Having a family history of prostate cancer. °Having a family history of cancer of the breasts or ovaries. °Having genes that are passed from parent to child (inherited). °Having Lynch syndrome. °African American men and men of African descent are diagnosed with prostate cancer at higher rates than other men. °What are the signs or symptoms? °Problems peeing (urinating). This may include: °A stream that is weak, or pee that stops and starts. °Trouble starting or stopping your pee. °Trouble emptying all of your pee. °Needing to pee more often, especially at night. °Blood in your pee or semen. °Pain in the: °Lower back. °Lower belly (abdomen). °Hips. °Trouble getting an erection. °Weakness or numbness in the legs or feet. °How is this treated? °Treatment for this condition depends on: °How much the cancer has spread. °Your age. °The kind of treatment you want. °Your health. °Treatments include: °Being watched. This is called observation. You will be tested from time to time, but you will not get treated. Tests are to make sure that the cancer is not growing. °Surgery. This may be done to: °Take out (remove) the prostate. °Freeze and kill cancer cells. °Radiation. This uses a strong beam of energy to kill cancer cells. °Chemotherapy. This uses medicines that stop cancer cells from increasing. This kills cancer cells and healthy cells. °Targeted therapy. This kills cancer cells only. Healthy cells are not affected. °Hormone treatment. This stops the body from making hormones that help the cancer cells grow. °Follow these instructions at home: °Lifestyle °Do not smoke or use any products that contain nicotine or tobacco.  If you need help quitting, ask your doctor. °Eat a healthy diet. °Treatment may affect your ability to have sex. If you have a partner, touch, hold, hug, and caress your partner to have intimate moments. °Get plenty of sleep. °Ask your doctor for help to find a support group for men with prostate cancer. °General instructions °Take over-the-counter and prescription medicines only as told by your doctor. °If you have to go to the hospital, let your cancer doctor (oncologist) know. °Keep all follow-up visits. °Where to find more information °American Cancer Society: www.cancer.org °American Society of Clinical Oncology: www.cancer.net °National Cancer Institute: www.cancer.gov °Contact a doctor if: °You have new or more trouble peeing. °You have new or more blood in your pee. °You have new or more pain in your hips, back, or chest. °Get help right away if: °You have weakness in your legs. °You lose feeling in your legs. °You cannot control your pee or your poop (stool). °You have chills or a fever. °Summary °The prostate is a male gland that helps make semen. °Prostate cancer is when abnormal cells grow in this gland. °Treatment includes doing surgery, using medicines, using strong beams of energy, or watching without treatment. °Ask your doctor for help to find a support group for men with prostate cancer. °Contact a doctor if you have problems peeing or have any new pain that you did not have before. °This information is not intended to replace advice given to you by your health care provider. Make sure you discuss any questions you have with your health care provider. °Document Revised: 02/16/2021 Document Reviewed: 02/16/2021 °Elsevier   Patient Education © 2022 [employer]vier Inc. ° °

## 2021-12-26 NOTE — Progress Notes (Signed)
Urological Symptom Review  Patient is experiencing the following symptoms: Frequent urination Get up at night to urinate Erection problems (male only)   Review of Systems  Gastrointestinal (upper)  : Negative for upper GI symptoms  Gastrointestinal (lower) : Constipation  Constitutional : Fatigue  Skin: Negative for skin symptoms  Eyes: Negative for eye symptoms  Ear/Nose/Throat : Negative for Ear/Nose/Throat symptoms  Hematologic/Lymphatic: Negative for Hematologic/Lymphatic symptoms  Cardiovascular : Negative for cardiovascular symptoms  Respiratory : Negative for respiratory symptoms  Endocrine: Negative for endocrine symptoms  Musculoskeletal: Back pain  Neurological: Negative for neurological symptoms  Psychologic: Negative for psychiatric symptoms

## 2021-12-26 NOTE — Progress Notes (Signed)
12/26/2021 9:19 AM   Derrick Murray 1961/06/15 [id number]  Referring provider: [facility], [place] [address]  Followup prostate cancer and BPH   HPI: Derrick Murray is a 61yo here for followup for Prostate cancer and BPH. No recent PSA. Patient on ADTand is due fro ADT today and 1 more dose in 6 months. IPSS 9 QOL 2. Urine stream strong on uroxatral 10mg  BID. Urine stream strong. Nocturia 3x. He is seeing [facility] for radiation proctitis. No other complaints today   PMH: Past Medical History:  Diagnosis Date   Anxiety    Arthritis    Coronary artery disease    Diabetes mellitus without complication ([place])    type2   GERD (gastroesophageal reflux disease)    Hyperlipidemia    Hypertension    Prostate cancer ([place])    Wears glasses    Wears partial dentures    lower    Surgical History: Past Surgical History:  Procedure Laterality Date   CARDIOVERSION N/A 06/28/2021   Procedure: CARDIOVERSION;  Surgeon: Dixie Dials, MD;  Location: [facility] [place];  Service: Cardiovascular;  Laterality: N/A;   CARDIOVERSION N/A 12/02/2021   Procedure: CARDIOVERSION;  Surgeon: Dixie Dials, MD;  Location: [place];  Service: Cardiovascular;  Laterality: N/A;   CORONARY STENT INTERVENTION N/A 11/15/2021   Procedure: CORONARY STENT INTERVENTION;  Surgeon: Nigel Mormon, MD;  Location: [place] [facility];  Service: Cardiovascular;  Laterality: N/A;   GOLD SEED IMPLANT N/A 01/10/2021   Procedure: GOLD SEED IMPLANT;  Surgeon: Cleon Gustin, MD;  Location: [facility];  Service: Urology;  Laterality: N/A;   HERNIA REPAIR  yrs ago   umbilical hernia   LEFT HEART CATH AND CORONARY ANGIOGRAPHY N/A 11/15/2021   Procedure: LEFT HEART CATH AND CORONARY ANGIOGRAPHY;  Surgeon: Dixie Dials, MD;  Location: [place] [facility];  Service: Cardiovascular;  Laterality: N/A;   LEFT HEART CATH AND CORONARY ANGIOGRAPHY N/A 12/01/2021    Procedure: LEFT HEART CATH AND CORONARY ANGIOGRAPHY;  Surgeon: Dixie Dials, MD;  Location: [facility] [facility];  Service: Cardiovascular;  Laterality: N/A;   PROSTATE BIOPSY  11/2020   SPACE OAR INSTILLATION N/A 01/10/2021   Procedure: SPACE OAR INSTILLATION;  Surgeon: Cleon Gustin, MD;  Location: [facility];  Service: Urology;  Laterality: N/A;   TENDON REPAIR Left early 2021   left arm    Home Medications:  Allergies as of 12/26/2021       Reactions   Penicillins Swelling, Rash        Medication List        Accurate as of December 26, 2021  9:19 AM. If you have any questions, ask your nurse or doctor.          alfuzosin 10 MG 24 hr tablet Commonly known as: UROXATRAL Take 1 tablet (10 mg total) by mouth in the morning and at bedtime.   ALPRAZolam 0.25 MG tablet Commonly known as: XANAX Take 1 tablet (0.25 mg total) by mouth at bedtime.   apixaban 5 MG Tabs tablet Commonly known as: ELIQUIS Take 5 mg by mouth 2 (two) times daily.   aspirin 81 MG EC tablet Take 1 tablet (81 mg total) by mouth daily. Swallow whole. Stop medication after 30 days. Continue Eliquis and Clopidogrel.   atorvastatin 40 MG tablet Commonly known as: LIPITOR Take 40 mg by mouth in the morning.   benazepril 20 MG tablet Commonly known as: LOTENSIN  Take 1 tablet (20 mg total) by mouth daily.   clopidogrel 75 MG tablet Commonly known as: Plavix Take 1 tablet (75 mg total) by mouth daily.   diltiazem 120 MG 24 hr capsule Commonly known as: CARDIZEM CD Take 1 capsule (120 mg total) by mouth daily.   FLUoxetine 40 MG capsule Commonly known as: PROZAC Take 40 mg by mouth in the morning.   Jardiance 25 MG Tabs tablet Generic drug: empagliflozin Take 25 mg by mouth in the morning.   metFORMIN 1000 MG tablet Commonly known as: GLUCOPHAGE Take 1 tablet (1,000 mg total) by mouth 2 (two) times daily. Start from Saturday-12/03/2021   metoprolol succinate 50 MG 24 hr  tablet Commonly known as: TOPROL-XL Take 50 mg by mouth daily.   nitroGLYCERIN 0.4 MG SL tablet Commonly known as: NITROSTAT Place 1 tablet (0.4 mg total) under the tongue every 5 (five) minutes x 3 doses as needed for chest pain.   pantoprazole 40 MG tablet Commonly known as: PROTONIX Take 1 tablet (40 mg total) by mouth daily.   sucralfate 1 g tablet Commonly known as: CARAFATE Take 1 g by mouth 4 (four) times daily.   Trulicity 2.45 YK/9.9IP Sopn Generic drug: Dulaglutide Inject 0.5 mg into the skin once a week.        Allergies:  Allergies  Allergen Reactions   Penicillins Swelling and Rash    Family History: Family History  Problem Relation Age of Onset   Colon cancer Maternal Grandfather    Heart attack Maternal Grandfather    Breast cancer Neg Hx    Prostate cancer Neg Hx    Pancreatic cancer Neg Hx     Social History:  reports that he has never smoked. He quit smokeless tobacco use about 32 years ago.  His smokeless tobacco use included chew. He reports current alcohol use. He reports that he does not use drugs.  ROS: All other review of systems were reviewed and are negative except what is noted above in HPI  Physical Exam: BP 112/74    Pulse 75    Ht 6\' 1"  (1.854 m)    Wt 247 lb (112 kg)    BMI 32.59 kg/m   Constitutional:  Alert and oriented, No acute distress. HEENT: [place] AT, moist mucus membranes.  Trachea midline, no masses. Cardiovascular: No clubbing, cyanosis, or edema. Respiratory: Normal respiratory effort, no increased work of breathing. GI: Abdomen is soft, nontender, nondistended, no abdominal masses GU: No CVA tenderness.  Lymph: No cervical or inguinal lymphadenopathy. Skin: No rashes, bruises or suspicious lesions. Neurologic: Grossly intact, no focal deficits, moving all 4 extremities. Psychiatric: Normal mood and affect.  Laboratory Data: Lab Results  Component Value Date   WBC 6.1 12/02/2021   HGB 15.7 12/02/2021   HCT 46.0  12/02/2021   MCV 96.0 12/02/2021   PLT 187 12/02/2021    Lab Results  Component Value Date   CREATININE 1.01 12/02/2021    No results found for: PSA  Lab Results  Component Value Date   TESTOSTERONE 39 (L) 12/16/2020    Lab Results  Component Value Date   HGBA1C 7.5 (H) 11/16/2021    Urinalysis    Component Value Date/Time   COLORURINE YELLOW 07/15/2007 0841   APPEARANCEUR Clear 06/15/2021 1112   LABSPEC <1.005 (L) 07/15/2007 0841   PHURINE 6.0 07/15/2007 0841   GLUCOSEU Trace (A) 06/15/2021 1112   HGBUR NEGATIVE 07/15/2007 0841   BILIRUBINUR Negative 06/15/2021 1112   KETONESUR NEGATIVE 07/15/2007  0841   PROTEINUR Negative 06/15/2021 1112   PROTEINUR NEGATIVE 07/15/2007 0841   UROBILINOGEN 0.2 07/15/2007 0841   NITRITE Negative 06/15/2021 1112   NITRITE NEGATIVE 07/15/2007 0841   LEUKOCYTESUR Negative 06/15/2021 1112    Lab Results  Component Value Date   LABMICR See below: 06/15/2021   WBCUA None seen 06/15/2021   LABEPIT None seen 06/15/2021   MUCUS Present 06/15/2021   BACTERIA None seen 06/15/2021    Pertinent Imaging:  No results found for this or any previous visit.  No results found for this or any previous visit.  No results found for this or any previous visit.  No results found for this or any previous visit.  No results found for this or any previous visit.  No results found for this or any previous visit.  No results found for this or any previous visit.  No results found for this or any previous visit.   Assessment & Plan:    1. Prostate cancer ([place]) -PSA today, RTC 6 months with PSA - Urinalysis, Routine w reflex microscopic - leuprolide (6 Month) (ELIGARD) injection 45 mg - PSA - PSA; Future  2. Benign prostatic hyperplasia with urinary obstruction -Continue Uroxatral 10mg  qhs  3. Weak urinary stream -continue uroxatral 10mg  BID   No follow-ups on file.  Nicolette Bang, MD  [facility] [place]

## 2021-12-27 LAB — PSA: Prostate Specific Ag, Serum: <0.1 ng/mL (ref 0.0–4.0)

## 2021-12-28 NOTE — Progress Notes (Signed)
Results mailed 

## 2022-06-19 ENCOUNTER — Other Ambulatory Visit: Payer: BC Managed Care – PPO

## 2022-06-20 ENCOUNTER — Other Ambulatory Visit: Payer: BC Managed Care – PPO

## 2022-06-20 DIAGNOSIS — C61 Malignant neoplasm of prostate: Secondary | ICD-10-CM

## 2022-06-21 LAB — PSA: Prostate Specific Ag, Serum: <0.1 ng/mL (ref 0.0–4.0)

## 2022-06-26 ENCOUNTER — Other Ambulatory Visit: Payer: Self-pay | Admitting: Urology

## 2022-06-26 ENCOUNTER — Encounter: Payer: Self-pay | Admitting: Urology

## 2022-06-26 ENCOUNTER — Ambulatory Visit (INDEPENDENT_AMBULATORY_CARE_PROVIDER_SITE_OTHER): Payer: BC Managed Care – PPO | Admitting: Urology

## 2022-06-26 VITALS — BP 150/78 | HR 67

## 2022-06-26 DIAGNOSIS — R3912 Poor urinary stream: Secondary | ICD-10-CM

## 2022-06-26 DIAGNOSIS — N138 Other obstructive and reflux uropathy: Secondary | ICD-10-CM

## 2022-06-26 DIAGNOSIS — N401 Enlarged prostate with lower urinary tract symptoms: Secondary | ICD-10-CM | POA: Diagnosis not present

## 2022-06-26 DIAGNOSIS — C61 Malignant neoplasm of prostate: Secondary | ICD-10-CM

## 2022-06-26 LAB — URINALYSIS, ROUTINE W REFLEX MICROSCOPIC
Bilirubin, UA: NEGATIVE
Ketones, UA: NEGATIVE
Leukocytes,UA: NEGATIVE
Nitrite, UA: NEGATIVE
Protein,UA: NEGATIVE
RBC, UA: NEGATIVE
Specific Gravity, UA: 1.01 (ref 1.005–1.030)
Urobilinogen, Ur: 0.2 mg/dL (ref 0.2–1.0)
pH, UA: 5 (ref 5.0–7.5)

## 2022-06-26 MED ORDER — LEUPROLIDE ACETATE (6 MONTH) 45 MG ~~LOC~~ KIT: 45.0000 mg | PACK | Freq: Once | SUBCUTANEOUS | Status: DC

## 2022-06-26 MED ORDER — SILODOSIN 8 MG PO CAPS: 8.0000 mg | ORAL_CAPSULE | Freq: Every day | ORAL | 11 refills | Status: DC

## 2022-06-26 MED ORDER — ALFUZOSIN HCL ER 10 MG PO TB24: 10.0000 mg | ORAL_TABLET | Freq: Two times a day (BID) | ORAL | 11 refills | Status: DC

## 2022-06-26 NOTE — Progress Notes (Deleted)
post void residual=12 

## 2022-06-26 NOTE — Patient Instructions (Signed)

## 2022-06-26 NOTE — Addendum Note (Signed)
Addended byIris Pert on: 06/26/2022 09:49 AM   Modules accepted: Orders

## 2022-06-26 NOTE — Progress Notes (Signed)
06/26/2022 8:47 AM   Derrick Murray 11-18-1961 [id number]  Referring provider: [facility], [place] [address]  Followup Prostate Cancer and BPH   HPI: [name] is a 61yo here for followup for prostate cancer and BPH. PSA remains undetectable. IPSS 15 QOL 3 on uroxatral '10mg'$  BID. Urine stream weaker. He has nocturia 2-4x. He has intermittent dysuria.    PMH: Past Medical History:  Diagnosis Date   Anxiety    Arthritis    Coronary artery disease    Diabetes mellitus without complication ([place])    type2   GERD (gastroesophageal reflux disease)    Hyperlipidemia    Hypertension    Prostate cancer (Derrick Murray)    Wears glasses    Wears partial dentures    lower    Surgical History: Past Surgical History:  Procedure Laterality Date   CARDIOVERSION N/A 06/28/2021   Procedure: CARDIOVERSION;  Surgeon: Dixie Dials, MD;  Location: [facility];  Service: Cardiovascular;  Laterality: N/A;   CARDIOVERSION N/A 12/02/2021   Procedure: CARDIOVERSION;  Surgeon: Dixie Dials, MD;  Location: [facility];  Service: Cardiovascular;  Laterality: N/A;   CORONARY STENT INTERVENTION N/A 11/15/2021   Procedure: CORONARY STENT INTERVENTION;  Surgeon: Nigel Mormon, MD;  Location: [facility];  Service: Cardiovascular;  Laterality: N/A;   GOLD SEED IMPLANT N/A 01/10/2021   Procedure: GOLD SEED IMPLANT;  Surgeon: Cleon Gustin, MD;  Location: [facility];  Service: Urology;  Laterality: N/A;   HERNIA REPAIR  yrs ago   umbilical hernia   LEFT HEART CATH AND CORONARY ANGIOGRAPHY N/A 11/15/2021   Procedure: LEFT HEART CATH AND CORONARY ANGIOGRAPHY;  Surgeon: Dixie Dials, MD;  Location: [facility];  Service: Cardiovascular;  Laterality: N/A;   LEFT HEART CATH AND CORONARY ANGIOGRAPHY N/A 12/01/2021   Procedure: LEFT HEART CATH AND CORONARY ANGIOGRAPHY;  Surgeon: Dixie Dials, MD;  Location: [facility];   Service: Cardiovascular;  Laterality: N/A;   PROSTATE BIOPSY  11/2020   SPACE OAR INSTILLATION N/A 01/10/2021   Procedure: SPACE OAR INSTILLATION;  Surgeon: Cleon Gustin, MD;  Location: [facility];  Service: Urology;  Laterality: N/A;   TENDON REPAIR Left early 2021   left arm    Home Medications:  Allergies as of 06/26/2022       Reactions   Penicillins Swelling, Rash        Medication List        Accurate as of June 26, 2022  8:47 AM. If you have any questions, ask your nurse or doctor.          alfuzosin 10 MG 24 hr tablet Commonly known as: UROXATRAL Take 1 tablet (10 mg total) by mouth in the morning and at bedtime.   ALPRAZolam 0.25 MG tablet Commonly known as: XANAX Take 1 tablet (0.25 mg total) by mouth at bedtime.   apixaban 5 MG Tabs tablet Commonly known as: ELIQUIS Take 5 mg by mouth 2 (two) times daily.   aspirin EC 81 MG tablet Take 1 tablet (81 mg total) by mouth daily. Swallow whole. Stop medication after 30 days. Continue Eliquis and Clopidogrel.   atorvastatin 40 MG tablet Commonly known as: LIPITOR Take 40 mg by mouth in the morning.   benazepril 20 MG tablet Commonly known as: LOTENSIN Take 1 tablet (20 mg total) by mouth daily.   clopidogrel 75 MG tablet Commonly known as: Plavix Take 1 tablet (  75 mg total) by mouth daily.   diltiazem 120 MG 24 hr capsule Commonly known as: CARDIZEM CD Take 1 capsule (120 mg total) by mouth daily.   FLUoxetine 40 MG capsule Commonly known as: PROZAC Take 40 mg by mouth in the morning.   Jardiance 25 MG Tabs tablet Generic drug: empagliflozin Take 25 mg by mouth in the morning.   metFORMIN 1000 MG tablet Commonly known as: GLUCOPHAGE Take 1 tablet (1,000 mg total) by mouth 2 (two) times daily. Start from Saturday-12/03/2021   metoprolol succinate 50 MG 24 hr tablet Commonly known as: TOPROL-XL Take 50 mg by mouth daily.   nitroGLYCERIN 0.4 MG SL tablet Commonly known  as: NITROSTAT Place 1 tablet (0.4 mg total) under the tongue every 5 (five) minutes x 3 doses as needed for chest pain.   pantoprazole 40 MG tablet Commonly known as: PROTONIX Take 1 tablet (40 mg total) by mouth daily.   sucralfate 1 g tablet Commonly known as: CARAFATE Take 1 g by mouth 4 (four) times daily.   Trulicity 6.43 PI/9.5JO Sopn Generic drug: Dulaglutide Inject 0.5 mg into the skin once a week.        Allergies:  Allergies  Allergen Reactions   Penicillins Swelling and Rash    Family History: Family History  Problem Relation Age of Onset   Colon cancer Maternal Grandfather    Heart attack Maternal Grandfather    Breast cancer Neg Hx    Prostate cancer Neg Hx    Pancreatic cancer Neg Hx     Social History:  reports that he has never smoked. He quit smokeless tobacco use about 32 years ago.  His smokeless tobacco use included chew. He reports current alcohol use. He reports that he does not use drugs.  ROS: All other review of systems were reviewed and are negative except what is noted above in HPI  Physical Exam: BP (!) 150/78   Pulse 67   Constitutional:  Alert and oriented, No acute distress. HEENT: [place] AT, moist mucus membranes.  Trachea midline, no masses. Cardiovascular: No clubbing, cyanosis, or edema. Respiratory: Normal respiratory effort, no increased work of breathing. GI: Abdomen is soft, nontender, nondistended, no abdominal masses GU: No CVA tenderness.  Lymph: No cervical or inguinal lymphadenopathy. Skin: No rashes, bruises or suspicious lesions. Neurologic: Grossly intact, no focal deficits, moving all 4 extremities. Psychiatric: Normal mood and affect.  Laboratory Data: Lab Results  Component Value Date   WBC 6.1 12/02/2021   HGB 15.7 12/02/2021   HCT 46.0 12/02/2021   MCV 96.0 12/02/2021   PLT 187 12/02/2021    Lab Results  Component Value Date   CREATININE 1.01 12/02/2021    No results found for: "PSA"  Lab Results   Component Value Date   TESTOSTERONE 39 (L) 12/16/2020    Lab Results  Component Value Date   HGBA1C 7.5 (H) 11/16/2021    Urinalysis    Component Value Date/Time   COLORURINE YELLOW 07/15/2007 0841   APPEARANCEUR Clear 12/26/2021 0911   LABSPEC <1.005 (L) 07/15/2007 0841   PHURINE 6.0 07/15/2007 0841   GLUCOSEU 3+ (A) 12/26/2021 0911   HGBUR NEGATIVE 07/15/2007 0841   BILIRUBINUR Negative 12/26/2021 0911   KETONESUR NEGATIVE 07/15/2007 0841   PROTEINUR Negative 12/26/2021 0911   PROTEINUR NEGATIVE 07/15/2007 0841   UROBILINOGEN 0.2 07/15/2007 0841   NITRITE Negative 12/26/2021 0911   NITRITE NEGATIVE 07/15/2007 0841   LEUKOCYTESUR Negative 12/26/2021 0911    Lab Results  Component  Value Date   LABMICR Comment 12/26/2021   WBCUA None seen 06/15/2021   LABEPIT None seen 06/15/2021   MUCUS Present 06/15/2021   BACTERIA None seen 06/15/2021    Pertinent Imaging:  No results found for this or any previous visit.  No results found for this or any previous visit.  No results found for this or any previous visit.  No results found for this or any previous visit.  No results found for this or any previous visit.  No results found for this or any previous visit.  No results found for this or any previous visit.  No results found for this or any previous visit.   Assessment & Plan:    1. Prostate cancer ([place]) -RTC 6 months with PSA - Urinalysis, Routine w reflex microscopic - leuprolide (6 Month) (ELIGARD) injection 45 mg  2. Benign prostatic hyperplasia with urinary obstruction -We will trial rapaflo '8mg'$   3. Weak urinary stream -We will trial rapaflo '8mg'$     No follow-ups on file.  Nicolette Bang, MD  [facility] [place]

## 2022-07-10 ENCOUNTER — Telehealth: Payer: Self-pay

## 2022-07-10 NOTE — Telephone Encounter (Signed)
I informed pt we do not have any samples in office, he may could try [place] for a cheaper price at another pharmacy.  Patient agreed to try that.

## 2022-07-10 NOTE — Telephone Encounter (Signed)
Patient called advising that the medication silodosin (RAPAFLO) 8 MG CAPS capsule that was sent to pharmacy was $80.00 and they wont be able to afford it for another two weeks. I advised patient we did not offer samples for this medication. He wanted to know if there were any other samples we had that he could use for the next two weeks.

## 2022-10-04 ENCOUNTER — Encounter: Payer: Self-pay | Admitting: Urology

## 2022-10-04 ENCOUNTER — Ambulatory Visit (INDEPENDENT_AMBULATORY_CARE_PROVIDER_SITE_OTHER): Payer: BC Managed Care – PPO | Admitting: Urology

## 2022-10-04 VITALS — BP 115/74 | HR 68

## 2022-10-04 DIAGNOSIS — R32 Unspecified urinary incontinence: Secondary | ICD-10-CM | POA: Diagnosis not present

## 2022-10-04 DIAGNOSIS — R300 Dysuria: Secondary | ICD-10-CM

## 2022-10-04 LAB — URINALYSIS, ROUTINE W REFLEX MICROSCOPIC
Bilirubin, UA: NEGATIVE
Leukocytes,UA: NEGATIVE
Nitrite, UA: NEGATIVE
Protein,UA: NEGATIVE
RBC, UA: NEGATIVE
Specific Gravity, UA: 1.01 (ref 1.005–1.030)
Urobilinogen, Ur: 0.2 mg/dL (ref 0.2–1.0)
pH, UA: 5.5 (ref 5.0–7.5)

## 2022-10-04 MED ORDER — SILODOSIN 8 MG PO CAPS: 8.0000 mg | ORAL_CAPSULE | Freq: Every day | ORAL | 11 refills | Status: DC

## 2022-10-04 MED ORDER — SULFAMETHOXAZOLE-TRIMETHOPRIM 800-160 MG PO TABS: 1.0000 | ORAL_TABLET | Freq: Two times a day (BID) | ORAL | 0 refills | Status: DC

## 2022-10-04 NOTE — Patient Instructions (Signed)

## 2022-10-04 NOTE — Progress Notes (Signed)
10/04/2022 2:16 PM   [name] Aug 04, 1961 [id number]  Referring provider: [facility], [place] [address]  Dysuria and urge incontinence   HPI: [name] is a 61yo here for evaluation of dysuria and a weak urinary stream. Since last visit his urine stream has weakened and the dysuria has worsened. IPSS 14 QOL 4 on uroxatral '10mg'$ . He dis not get rapaflo due to cost. He has new worsening urgency and urge incontinence and occasional fecal incontinence. He has an L4/L5 bulging disc   PMH: Past Medical History:  Diagnosis Date   Anxiety    Arthritis    Coronary artery disease    Diabetes mellitus without complication ([place])    type2   GERD (gastroesophageal reflux disease)    Hyperlipidemia    Hypertension    Prostate cancer ([place])    Wears glasses    Wears partial dentures    lower    Surgical History: Past Surgical History:  Procedure Laterality Date   CARDIOVERSION N/A 06/28/2021   Procedure: CARDIOVERSION;  Surgeon: Dixie Dials, MD;  Location: [facility];  Service: Cardiovascular;  Laterality: N/A;   CARDIOVERSION N/A 12/02/2021   Procedure: CARDIOVERSION;  Surgeon: Dixie Dials, MD;  Location: [facility];  Service: Cardiovascular;  Laterality: N/A;   CORONARY STENT INTERVENTION N/A 11/15/2021   Procedure: CORONARY STENT INTERVENTION;  Surgeon: Nigel Mormon, MD;  Location: [facility];  Service: Cardiovascular;  Laterality: N/A;   GOLD SEED IMPLANT N/A 01/10/2021   Procedure: GOLD SEED IMPLANT;  Surgeon: Cleon Gustin, MD;  Location: [facility];  Service: Urology;  Laterality: N/A;   HERNIA REPAIR  yrs ago   umbilical hernia   LEFT HEART CATH AND CORONARY ANGIOGRAPHY N/A 11/15/2021   Procedure: LEFT HEART CATH AND CORONARY ANGIOGRAPHY;  Surgeon: Dixie Dials, MD;  Location: [facility];  Service: Cardiovascular;  Laterality: N/A;   LEFT HEART CATH AND CORONARY ANGIOGRAPHY  N/A 12/01/2021   Procedure: LEFT HEART CATH AND CORONARY ANGIOGRAPHY;  Surgeon: Dixie Dials, MD;  Location: [facility];  Service: Cardiovascular;  Laterality: N/A;   PROSTATE BIOPSY  11/2020   SPACE OAR INSTILLATION N/A 01/10/2021   Procedure: SPACE OAR INSTILLATION;  Surgeon: Cleon Gustin, MD;  Location[facility];  Service: Urology;  Laterality: N/A;   TENDON REPAIR Left early 2021   left arm    Home Medications:  Allergies as of 10/04/2022       Reactions   Penicillins Swelling, Rash        Medication List        Accurate as of October 04, 2022  2:16 PM. If you have any questions, ask your nurse or doctor.          alfuzosin 10 MG 24 hr tablet Commonly known as: UROXATRAL TAKE 1 TABLET (10 MG TOTAL) BY MOUTH IN THE MORNING AND AT BEDTIME   ALPRAZolam 0.25 MG tablet Commonly known as: XANAX Take 1 tablet (0.25 mg total) by mouth at bedtime.   amiodarone 200 MG tablet Commonly known as: PACERONE Take by mouth.   apixaban 5 MG Tabs tablet Commonly known as: ELIQUIS Take 5 mg by mouth 2 (two) times daily.   aspirin EC 81 MG tablet Take 1 tablet (81 mg total) by mouth daily. Swallow whole. Stop medication after 30 days. Continue Eliquis and Clopidogrel.   atorvastatin 40 MG tablet Commonly known as: LIPITOR Take 40 mg  by mouth in the morning.   benazepril 20 MG tablet Commonly known as: LOTENSIN Take 1 tablet (20 mg total) by mouth daily.   clopidogrel 75 MG tablet Commonly known as: Plavix Take 1 tablet (75 mg total) by mouth daily.   diltiazem 120 MG 24 hr capsule Commonly known as: CARDIZEM CD Take 1 capsule (120 mg total) by mouth daily.   FLUoxetine 40 MG capsule Commonly known as: PROZAC Take 40 mg by mouth in the morning.   Jardiance 25 MG Tabs tablet Generic drug: empagliflozin Take 25 mg by mouth in the morning.   metFORMIN 1000 MG tablet Commonly known as: GLUCOPHAGE Take 1 tablet (1,000 mg total) by mouth  2 (two) times daily. Start from Saturday-12/03/2021   metoprolol succinate 50 MG 24 hr tablet Commonly known as: TOPROL-XL Take 50 mg by mouth daily.   nitroGLYCERIN 0.4 MG SL tablet Commonly known as: NITROSTAT Place 1 tablet (0.4 mg total) under the tongue every 5 (five) minutes x 3 doses as needed for chest pain.   pantoprazole 40 MG tablet Commonly known as: PROTONIX Take 1 tablet (40 mg total) by mouth daily.   silodosin 8 MG Caps capsule Commonly known as: RAPAFLO Take 1 capsule (8 mg total) by mouth at bedtime.   sucralfate 1 g tablet Commonly known as: CARAFATE Take 1 g by mouth 4 (four) times daily.   Trulicity 5.57 DU/2.0UR Sopn Generic drug: Dulaglutide Inject 0.5 mg into the skin once a week.        Allergies:  Allergies  Allergen Reactions   Penicillins Swelling and Rash    Family History: Family History  Problem Relation Age of Onset   Colon cancer Maternal Grandfather    Heart attack Maternal Grandfather    Breast cancer Neg Hx    Prostate cancer Neg Hx    Pancreatic cancer Neg Hx     Social History:  reports that he has never smoked. He quit smokeless tobacco use about 32 years ago.  His smokeless tobacco use included chew. He reports current alcohol use. He reports that he does not use drugs.  ROS: All other review of systems were reviewed and are negative except what is noted above in HPI  Physical Exam: BP 115/74   Pulse 68   Constitutional:  Alert and oriented, No acute distress. HEENT: [place] AT, moist mucus membranes.  Trachea midline, no masses. Cardiovascular: No clubbing, cyanosis, or edema. Respiratory: Normal respiratory effort, no increased work of breathing. GI: Abdomen is soft, nontender, nondistended, no abdominal masses GU: No CVA tenderness.  Lymph: No cervical or inguinal lymphadenopathy. Skin: No rashes, bruises or suspicious lesions. Neurologic: Grossly intact, no focal deficits, moving all 4 extremities. Psychiatric:  Normal mood and affect.  Laboratory Data: Lab Results  Component Value Date   WBC 6.1 12/02/2021   HGB 15.7 12/02/2021   HCT 46.0 12/02/2021   MCV 96.0 12/02/2021   PLT 187 12/02/2021    Lab Results  Component Value Date   CREATININE 1.01 12/02/2021    No results found for: "PSA"  Lab Results  Component Value Date   TESTOSTERONE 39 (L) 12/16/2020    Lab Results  Component Value Date   HGBA1C 7.5 (H) 11/16/2021    Urinalysis    Component Value Date/Time   COLORURINE YELLOW 07/15/2007 0841   APPEARANCEUR Clear 06/26/2022 0845   LABSPEC <1.005 (L) 07/15/2007 0841   PHURINE 6.0 07/15/2007 0841   GLUCOSEU 3+ (A) 06/26/2022 0845   HGBUR NEGATIVE  07/15/2007 0841   BILIRUBINUR Negative 06/26/2022 [place] 07/15/2007 0841   PROTEINUR Negative 06/26/2022 0845   PROTEINUR NEGATIVE 07/15/2007 0841   UROBILINOGEN 0.2 07/15/2007 0841   NITRITE Negative 06/26/2022 0845   NITRITE NEGATIVE 07/15/2007 0841   LEUKOCYTESUR Negative 06/26/2022 0845    Lab Results  Component Value Date   LABMICR Comment 06/26/2022   WBCUA None seen 06/15/2021   LABEPIT None seen 06/15/2021   MUCUS Present 06/15/2021   BACTERIA None seen 06/15/2021    Pertinent Imaging:  No results found for this or any previous visit.  No results found for this or any previous visit.  No results found for this or any previous visit.  No results found for this or any previous visit.  No results found for this or any previous visit.  No valid procedures specified. No results found for this or any previous visit.  No results found for this or any previous visit.   Assessment & Plan:    1. Burning with urination -bactrim DS BID for 28 days - Urinalysis, Routine w reflex microscopic  2. Urinary incontinence, unspecified type -Rapaflo '8mg'$  qhs   No follow-ups on file.  Nicolette Bang, MD  [facility] [place]

## 2022-10-31 ENCOUNTER — Telehealth: Payer: Self-pay | Admitting: *Deleted

## 2022-10-31 NOTE — Telephone Encounter (Signed)
Received fax from [facility] ~ [phone]~ telephone/ [phone]~ fax.    Medical records requested: chart notes from 01/10/2020- present   Medical Record Request Case ID: [id number]   No records noted from [facility] as patient has not been seen at our practice.   Faxed [facility] to advise that no records are available.

## 2022-10-31 NOTE — Telephone Encounter (Signed)
Received faxed confirmation.

## 2022-11-03 ENCOUNTER — Ambulatory Visit: Payer: BC Managed Care – PPO | Admitting: Urology

## 2022-12-18 ENCOUNTER — Other Ambulatory Visit: Payer: BC Managed Care – PPO

## 2022-12-27 ENCOUNTER — Ambulatory Visit: Payer: BC Managed Care – PPO | Admitting: Urology

## 2023-01-22 ENCOUNTER — Other Ambulatory Visit: Payer: BC Managed Care – PPO

## 2023-01-23 LAB — PSA: Prostate Specific Ag, Serum: <0.1 ng/mL (ref 0.0–4.0)

## 2023-01-29 ENCOUNTER — Ambulatory Visit: Payer: BC Managed Care – PPO | Admitting: Urology

## 2023-01-29 ENCOUNTER — Encounter: Payer: Self-pay | Admitting: Urology

## 2023-01-29 VITALS — BP 112/73 | HR 75

## 2023-01-29 DIAGNOSIS — R3912 Poor urinary stream: Secondary | ICD-10-CM | POA: Diagnosis not present

## 2023-01-29 DIAGNOSIS — N138 Other obstructive and reflux uropathy: Secondary | ICD-10-CM | POA: Diagnosis not present

## 2023-01-29 DIAGNOSIS — N401 Enlarged prostate with lower urinary tract symptoms: Secondary | ICD-10-CM | POA: Diagnosis not present

## 2023-01-29 DIAGNOSIS — R300 Dysuria: Secondary | ICD-10-CM

## 2023-01-29 MED ORDER — SILODOSIN 8 MG PO CAPS: 8.0000 mg | ORAL_CAPSULE | Freq: Every day | ORAL | 11 refills | Status: DC

## 2023-01-29 MED ORDER — DOXYCYCLINE HYCLATE 100 MG PO CAPS: 100.0000 mg | ORAL_CAPSULE | Freq: Two times a day (BID) | ORAL | 0 refills | Status: AC

## 2023-01-29 NOTE — Patient Instructions (Signed)

## 2023-01-29 NOTE — Progress Notes (Unsigned)
01/29/2023 9:39 AM   Derrick Murray Sep 19, 1961 [id number]  Referring provider: [facility], [place],  [place] [phone]  No chief complaint on file.   HPI: Mild dysuria. PSA <0.01. IPSS 8 QOL 2 on rapalfo '8mg'$ . 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   GERD (gastroesophageal reflux disease)    Hyperlipidemia    Hypertension    Prostate cancer ([place])    Wears glasses    Wears partial dentures    lower    Surgical History: Past Surgical History:  Procedure Laterality Date   CARDIOVERSION N/A 06/28/2021   Procedure: CARDIOVERSION;  Surgeon: Dixie Dials, MD;  Location: [facility];  Service: Cardiovascular;  Laterality: N/A;   CARDIOVERSION N/A 12/02/2021   Procedure: CARDIOVERSION;  Surgeon: Dixie Dials, MD;  Location: [facility];  Service: Cardiovascular;  Laterality: N/A;   CORONARY STENT INTERVENTION N/A 11/15/2021   Procedure: CORONARY STENT INTERVENTION;  Surgeon: Nigel Mormon, MD;  Location: [place] [facility];  Service: Cardiovascular;  Laterality: N/A;   GOLD SEED IMPLANT N/A 01/10/2021   Procedure: GOLD SEED IMPLANT;  Surgeon: Cleon Gustin, MD;  Location[facility];  Service: Urology;  Laterality: N/A;   HERNIA REPAIR  yrs ago   umbilical hernia   LEFT HEART CATH AND CORONARY ANGIOGRAPHY N/A 11/15/2021   Procedure: LEFT HEART CATH AND CORONARY ANGIOGRAPHY;  Surgeon: Dixie Dials, MD;  Location: [facility];  Service: Cardiovascular;  Laterality: N/A;   LEFT HEART CATH AND CORONARY ANGIOGRAPHY N/A 12/01/2021   Procedure: LEFT HEART CATH AND CORONARY ANGIOGRAPHY;  Surgeon: Dixie Dials, MD;  Location: [place] [facility];  Service: Cardiovascular;  Laterality: N/A;   PROSTATE BIOPSY  11/2020   SPACE OAR INSTILLATION N/A 01/10/2021    Procedure: SPACE OAR INSTILLATION;  Surgeon: Cleon Gustin, MD;  Location: [facility];  Service: Urology;  Laterality: N/A;   TENDON REPAIR Left early 2021   left arm    Home Medications:  Allergies as of 01/29/2023       Reactions   Penicillins Swelling, Rash        Medication List        Accurate as of January 29, 2023  9:39 AM. If you have any questions, ask your nurse or doctor.          alfuzosin 10 MG 24 hr tablet Commonly known as: UROXATRAL TAKE 1 TABLET (10 MG TOTAL) BY MOUTH IN THE MORNING AND AT BEDTIME   ALPRAZolam 0.25 MG tablet Commonly known as: XANAX Take 1 tablet (0.25 mg total) by mouth at bedtime.   amiodarone 200 MG tablet Commonly known as: PACERONE Take by mouth.   apixaban 5 MG Tabs tablet Commonly known as: ELIQUIS Take 5 mg by mouth 2 (two) times daily.   aspirin EC 81 MG tablet Take 1 tablet (81 mg total) by mouth daily. Swallow whole. Stop medication after 30 days. Continue Eliquis and Clopidogrel.   atorvastatin 40 MG tablet Commonly known as: LIPITOR Take 40 mg by mouth in the morning.   benazepril 20 MG tablet Commonly known as: LOTENSIN Take 1 tablet (20 mg total) by mouth daily.   diltiazem 120 MG 24 hr capsule Commonly known as: CARDIZEM CD Take 1 capsule (120 mg  total) by mouth daily.   FLUoxetine 40 MG capsule Commonly known as: PROZAC Take 40 mg by mouth in the morning.   Jardiance 25 MG Tabs tablet Generic drug: empagliflozin Take 25 mg by mouth in the morning.   metFORMIN 1000 MG tablet Commonly known as: GLUCOPHAGE Take 1 tablet (1,000 mg total) by mouth 2 (two) times daily. Start from Saturday-12/03/2021   metoprolol succinate 50 MG 24 hr tablet Commonly known as: TOPROL-XL Take 50 mg by mouth daily.   nitroGLYCERIN 0.4 MG SL tablet Commonly known as: NITROSTAT Place 1 tablet (0.4 mg total) under the tongue every 5 (five) minutes x 3 doses as needed for chest pain.   pantoprazole  40 MG tablet Commonly known as: PROTONIX Take 1 tablet (40 mg total) by mouth daily.   silodosin 8 MG Caps capsule Commonly known as: RAPAFLO Take 1 capsule (8 mg total) by mouth at bedtime.   sucralfate 1 g tablet Commonly known as: CARAFATE Take 1 g by mouth 4 (four) times daily.   sulfamethoxazole-trimethoprim 800-160 MG tablet Commonly known as: BACTRIM DS Take 1 tablet by mouth 2 (two) times daily.   Trulicity [id number] [id number] Sopn Generic drug: Dulaglutide Inject 0.5 mg into the skin once a week.        Allergies:  Allergies  Allergen Reactions   Penicillins Swelling and Rash    Family History: Family History  Problem Relation Age of Onset   Colon cancer Maternal Grandfather    Heart attack Maternal Grandfather    Breast cancer Neg Hx    Prostate cancer Neg Hx    Pancreatic cancer Neg Hx     Social History:  reports that he has never smoked. He quit smokeless tobacco use about 33 years ago.  His smokeless tobacco use included chew. He reports current alcohol use. He reports that he does not use drugs.  ROS: All other review of systems were reviewed and are negative except what is noted above in HPI  Physical Exam: BP 112/73   Pulse 75   Constitutional:  Alert and oriented, No acute distress. HEENT: [place] AT, moist mucus membranes.  Trachea midline, no masses. Cardiovascular: No clubbing, cyanosis, or edema. Respiratory: Normal respiratory effort, no increased work of breathing. GI: Abdomen is soft, nontender, nondistended, no abdominal masses GU: No CVA tenderness.  Lymph: No cervical or inguinal lymphadenopathy. Skin: No rashes, bruises or suspicious lesions. Neurologic: Grossly intact, no focal deficits, moving all 4 extremities. Psychiatric: Normal mood and affect.  Laboratory Data: Lab Results  Component Value Date   WBC 6.1 12/02/2021   HGB 15.7 12/02/2021   HCT 46.0 12/02/2021   MCV 96.0 12/02/2021   PLT 187 12/02/2021    Lab Results   Component Value Date   CREATININE 1.01 12/02/2021    No results found for: "PSA"  Lab Results  Component Value Date   TESTOSTERONE 39 (L) 12/16/2020    Lab Results  Component Value Date   HGBA1C 7.5 (H) 11/16/2021    Urinalysis    Component Value Date/Time   COLORURINE YELLOW 07/15/2007 0841   APPEARANCEUR Clear 10/04/2022 1411   LABSPEC <1.005 (L) 07/15/2007 0841   PHURINE 6.0 07/15/2007 0841   GLUCOSEU 3+ (A) 10/04/2022 1411   HGBUR NEGATIVE 07/15/2007 0841   BILIRUBINUR Negative 10/04/2022 1411   KETONESUR NEGATIVE 07/15/2007 0841   PROTEINUR Negative 10/04/2022 1411   PROTEINUR NEGATIVE 07/15/2007 0841   UROBILINOGEN 0.2 07/15/2007 0841   NITRITE Negative 10/04/2022 1411   NITRITE  NEGATIVE 07/15/2007 0841   LEUKOCYTESUR Negative 10/04/2022 1411    Lab Results  Component Value Date   LABMICR Comment 06/26/2022   WBCUA None seen 06/15/2021   LABEPIT None seen 06/15/2021   MUCUS Present 06/15/2021   BACTERIA None seen 06/15/2021    Pertinent Imaging: *** No results found for this or any previous visit.  No results found for this or any previous visit.  No results found for this or any previous visit.  No results found for this or any previous visit.  No results found for this or any previous visit.  No valid procedures specified. No results found for this or any previous visit.  No results found for this or any previous visit.   Assessment & Plan:    1. Benign prostatic hyperplasia with urinary obstruction -continue rapaflo '8mg'$  daily  2. Burning with urination -doxycycline '100mg'$  BID for 28 days - Urinalysis, Routine w reflex microscopic  3. Weak urinary stream -continue rapaflo '8mg'$  daily   No follow-ups on file.  Nicolette Bang, MD  [facility] [place]

## 2023-01-30 LAB — URINALYSIS, ROUTINE W REFLEX MICROSCOPIC
Bilirubin, UA: NEGATIVE
Ketones, UA: NEGATIVE
Leukocytes,UA: NEGATIVE
Nitrite, UA: NEGATIVE
Protein,UA: NEGATIVE
RBC, UA: NEGATIVE
Specific Gravity, UA: <1.005 — ABNORMAL LOW (ref 1.005–1.030)
Urobilinogen, Ur: 0.2 mg/dL (ref 0.2–1.0)
pH, UA: 5 (ref 5.0–7.5)

## 2023-04-25 ENCOUNTER — Ambulatory Visit: Payer: BC Managed Care – PPO | Admitting: Urology

## 2023-04-25 DIAGNOSIS — N138 Other obstructive and reflux uropathy: Secondary | ICD-10-CM

## 2023-05-09 ENCOUNTER — Other Ambulatory Visit: Payer: Self-pay | Admitting: Urology

## 2023-06-13 ENCOUNTER — Ambulatory Visit: Payer: BC Managed Care – PPO | Admitting: Urology

## 2023-07-22 ENCOUNTER — Other Ambulatory Visit: Payer: Self-pay | Admitting: Urology

## 2023-07-22 DIAGNOSIS — N138 Other obstructive and reflux uropathy: Secondary | ICD-10-CM

## 2023-08-01 ENCOUNTER — Ambulatory Visit: Payer: BC Managed Care – PPO | Admitting: Urology

## 2023-08-01 ENCOUNTER — Encounter: Payer: Self-pay | Admitting: Urology

## 2023-08-01 VITALS — BP 131/78 | HR 73

## 2023-08-01 DIAGNOSIS — N401 Enlarged prostate with lower urinary tract symptoms: Secondary | ICD-10-CM

## 2023-08-01 DIAGNOSIS — R3912 Poor urinary stream: Secondary | ICD-10-CM | POA: Diagnosis not present

## 2023-08-01 DIAGNOSIS — C61 Malignant neoplasm of prostate: Secondary | ICD-10-CM

## 2023-08-01 DIAGNOSIS — N138 Other obstructive and reflux uropathy: Secondary | ICD-10-CM | POA: Diagnosis not present

## 2023-08-01 LAB — URINALYSIS, ROUTINE W REFLEX MICROSCOPIC
Bilirubin, UA: NEGATIVE
Ketones, UA: NEGATIVE
Leukocytes,UA: NEGATIVE
Nitrite, UA: NEGATIVE
Protein,UA: NEGATIVE
RBC, UA: NEGATIVE
Specific Gravity, UA: 1.01 (ref 1.005–1.030)
Urobilinogen, Ur: 0.2 mg/dL (ref 0.2–1.0)
pH, UA: 6 (ref 5.0–7.5)

## 2023-08-01 MED ORDER — ALFUZOSIN HCL ER 10 MG PO TB24
10.0000 mg | ORAL_TABLET | Freq: Two times a day (BID) | ORAL | 3 refills | Status: DC
Start: 2023-08-01 — End: 2024-02-22

## 2023-08-01 NOTE — Patient Instructions (Signed)

## 2023-08-01 NOTE — Progress Notes (Unsigned)
08/01/2023 10:16 AM   Derrick Murray 12-23-1960 [id number]  Referring provider: Isabella Bowens, MD [address] [place],  [place] [address]  Followup BPH and Prostate cancer   HPI: Derrick Murray  is a 61yo here for followup for prostate cancer and BPH. No recent PSA. IPSS 10 QOl 2 on uroxatral 10mg  BID. Urine stream strong. No straining to urinate. Nocturia 3x. No other complaints today   PMH: Past Medical History:  Diagnosis Date   Anxiety    Arthritis    Coronary artery disease    Diabetes mellitus without complication (HCC)    type2   GERD (gastroesophageal reflux disease)    Hyperlipidemia    Hypertension    Prostate cancer (HCC)    Wears glasses    Wears partial dentures    lower    Surgical History: Past Surgical History:  Procedure Laterality Date   CARDIOVERSION N/A 06/28/2021   Procedure: CARDIOVERSION;  Surgeon: Orpah Cobb, MD;  Location: [facility] ENDOSCOPY;  Service: Cardiovascular;  Laterality: N/A;   CARDIOVERSION N/A 12/02/2021   Procedure: CARDIOVERSION;  Surgeon: Orpah Cobb, MD;  Location: [facility]OS[facility];  Service: Cardiovascular;  Laterality: N/A;   CORONARY STENT INTERVENTION N/A 11/15/2021   Procedure: CORONARY STENT INTERVENTION;  Surgeon: Elder Negus, MD;  Location: [facility];  Service: Cardiovascular;  Laterality: N/A;   GOLD SEED IMPLANT N/A 01/10/2021   Procedure: GOLD SEED IMPLANT;  Surgeon: Malen Gauze, MD;  Location: [facility];  Service: Urology;  Laterality: N/A;   HERNIA REPAIR  yrs ago   umbilical hernia   LEFT HEART CATH AND CORONARY ANGIOGRAPHY N/A 11/15/2021   Procedure: LEFT HEART CATH AND CORONARY ANGIOGRAPHY;  Surgeon: Orpah Cobb, MD;  Location: [facility];  Service: Cardiovascular;  Laterality: N/A;   LEFT HEART CATH AND CORONARY ANGIOGRAPHY N/A 12/01/2021   Procedure: LEFT HEART CATH AND CORONARY ANGIOGRAPHY;  Surgeon: Orpah Cobb, MD;  Location: [facility];   Service: Cardiovascular;  Laterality: N/A;   PROSTATE BIOPSY  11/2020   SPACE OAR INSTILLATION N/A 01/10/2021   Procedure: SPACE OAR INSTILLATION;  Surgeon: Malen Gauze, MD;  Location: [facility];  Service: Urology;  Laterality: N/A;   TENDON REPAIR Left early 2021   left arm    Home Medications:  Allergies as of 08/01/2023       Reactions   Penicillins Swelling, Rash        Medication List        Accurate as of August 01, 2023 10:16 AM. If you have any questions, ask your nurse or doctor.          alfuzosin 10 MG 24 hr tablet Commonly known as: UROXATRAL TAKE 1 TABLET (10 MG TOTAL) BY MOUTH IN THE MORNING AND AT BEDTIME   ALPRAZolam 0.25 MG tablet Commonly known as: XANAX Take 1 tablet (0.25 mg total) by mouth at bedtime.   amiodarone 200 MG tablet Commonly known as: PACERONE Take by mouth.   apixaban 5 MG Tabs tablet Commonly known as: ELIQUIS Take 5 mg by mouth 2 (two) times daily.   aspirin EC 81 MG tablet Take 1 tablet (81 mg total) by mouth daily. Swallow whole. Stop medication after 30 days. Continue Eliquis and Clopidogrel.   atorvastatin 40 MG tablet Commonly known as: LIPITOR Take 40 mg by mouth in the morning.   benazepril 20 MG tablet Commonly known as: LOTENSIN Take 1 tablet (20 mg total) by mouth  daily.   diltiazem 120 MG 24 hr capsule Commonly known as: CARDIZEM CD Take 1 capsule (120 mg total) by mouth daily.   doxycycline 100 MG capsule Commonly known as: VIBRAMYCIN Take 1 capsule (100 mg total) by mouth 2 (two) times daily.   DULoxetine 60 MG capsule Commonly known as: CYMBALTA 1 capsule Orally Twice a day   FLUoxetine 40 MG capsule Commonly known as: PROZAC Take 40 mg by mouth in the morning.   Jardiance 25 MG Tabs tablet Generic drug: empagliflozin Take 25 mg by mouth in the morning.   metFORMIN 1000 MG tablet Commonly known as: GLUCOPHAGE Take 1 tablet (1,000 mg total) by mouth 2 (two) times daily.  Start from Saturday-12/03/2021   metoprolol succinate 50 MG 24 hr tablet Commonly known as: TOPROL-XL Take 50 mg by mouth daily.   nitroGLYCERIN 0.4 MG SL tablet Commonly known as: NITROSTAT Place 1 tablet (0.4 mg total) under the tongue every 5 (five) minutes x 3 doses as needed for chest pain.   pantoprazole 40 MG tablet Commonly known as: PROTONIX Take 1 tablet (40 mg total) by mouth daily.   silodosin 8 MG Caps capsule Commonly known as: RAPAFLO Take 1 capsule (8 mg total) by mouth at bedtime.   sucralfate 1 g tablet Commonly known as: CARAFATE Take 1 g by mouth 4 (four) times daily.   sulfamethoxazole-trimethoprim 800-160 MG tablet Commonly known as: BACTRIM DS TAKE 1 TABLET BY MOUTH TWICE A DAY   Trulicity 0.75 MG/0.5ML Sopn Generic drug: Dulaglutide Inject 0.5 mg into the skin once a week.        Allergies:  Allergies  Allergen Reactions   Penicillins Swelling and Rash    Family History: Family History  Problem Relation Age of Onset   Colon cancer Maternal Grandfather    Heart attack Maternal Grandfather    Breast cancer Neg Hx    Prostate cancer Neg Hx    Pancreatic cancer Neg Hx     Social History:  reports that he has never smoked. He quit smokeless tobacco use about 33 years ago.  His smokeless tobacco use included chew. He reports current alcohol use. He reports that he does not use drugs.  ROS: All other review of systems were reviewed and are negative except what is noted above in HPI  Physical Exam: BP 131/78   Pulse 73   Constitutional:  Alert and oriented, No acute distress. HEENT: [place] AT, moist mucus membranes.  Trachea midline, no masses. Cardiovascular: No clubbing, cyanosis, or edema. Respiratory: Normal respiratory effort, no increased work of breathing. GI: Abdomen is soft, nontender, nondistended, no abdominal masses GU: No CVA tenderness.  Lymph: No cervical or inguinal lymphadenopathy. Skin: No rashes, bruises or suspicious  lesions. Neurologic: Grossly intact, no focal deficits, moving all 4 extremities. Psychiatric: Normal mood and affect.  Laboratory Data: Lab Results  Component Value Date   WBC 6.1 12/02/2021   HGB 15.7 12/02/2021   HCT 46.0 12/02/2021   MCV 96.0 12/02/2021   PLT 187 12/02/2021    Lab Results  Component Value Date   CREATININE 1.01 12/02/2021    No results found for: "PSA"  Lab Results  Component Value Date   TESTOSTERONE 39 (L) 12/16/2020    Lab Results  Component Value Date   HGBA1C 7.5 (H) 11/16/2021    Urinalysis    Component Value Date/Time   COLORURINE YELLOW 07/15/2007 0841   APPEARANCEUR Clear 01/29/2023 0934   LABSPEC <1.005 (L) 07/15/2007 6962  PHURINE 6.0 07/15/2007 0841   GLUCOSEU 3+ (A) 01/29/2023 0934   HGBUR NEGATIVE 07/15/2007 0841   BILIRUBINUR Negative 01/29/2023 0934   KETONESUR NEGATIVE 07/15/2007 0841   PROTEINUR Negative 01/29/2023 0934   PROTEINUR NEGATIVE 07/15/2007 0841   UROBILINOGEN 0.2 07/15/2007 0841   NITRITE Negative 01/29/2023 0934   NITRITE NEGATIVE 07/15/2007 0841   LEUKOCYTESUR Negative 01/29/2023 0934    Lab Results  Component Value Date   LABMICR Comment 01/29/2023   WBCUA None seen 06/15/2021   LABEPIT None seen 06/15/2021   MUCUS Present 06/15/2021   BACTERIA None seen 06/15/2021    Pertinent Imaging:  No results found for this or any previous visit.  No results found for this or any previous visit.  No results found for this or any previous visit.  No results found for this or any previous visit.  No results found for this or any previous visit.  No valid procedures specified. No results found for this or any previous visit.  No results found for this or any previous visit.   Assessment & Plan:    1. Benign prostatic hyperplasia with urinary obstruction -continue uroxatral 10mg  BID - Urinalysis, Routine w reflex microscopic  2. Prostate cancer (HCC) -PSA today -6 months with a PSA  3. Weak  urinary stream -continue uroxatral 10mg  BID   No follow-ups on file.  Wilkie Aye, MD  [facility] [place]

## 2023-08-02 LAB — PSA: Prostate Specific Ag, Serum: <0.1 ng/mL (ref 0.0–4.0)

## 2024-01-25 ENCOUNTER — Other Ambulatory Visit: Payer: Medicare PPO

## 2024-01-25 DIAGNOSIS — C61 Malignant neoplasm of prostate: Secondary | ICD-10-CM

## 2024-01-26 LAB — PSA: Prostate Specific Ag, Serum: <0.1 ng/mL (ref 0.0–4.0)

## 2024-02-01 ENCOUNTER — Ambulatory Visit: Payer: Medicare PPO | Admitting: Urology

## 2024-02-22 ENCOUNTER — Ambulatory Visit: Payer: Medicare PPO | Admitting: Urology

## 2024-02-22 VITALS — BP 93/61 | HR 71

## 2024-02-22 DIAGNOSIS — R3912 Poor urinary stream: Secondary | ICD-10-CM

## 2024-02-22 DIAGNOSIS — C61 Malignant neoplasm of prostate: Secondary | ICD-10-CM | POA: Diagnosis not present

## 2024-02-22 DIAGNOSIS — N138 Other obstructive and reflux uropathy: Secondary | ICD-10-CM | POA: Diagnosis not present

## 2024-02-22 DIAGNOSIS — N401 Enlarged prostate with lower urinary tract symptoms: Secondary | ICD-10-CM

## 2024-02-22 LAB — URINALYSIS, ROUTINE W REFLEX MICROSCOPIC
Bilirubin, UA: NEGATIVE
Ketones, UA: NEGATIVE
Leukocytes,UA: NEGATIVE
Nitrite, UA: NEGATIVE
Protein,UA: NEGATIVE
RBC, UA: NEGATIVE
Specific Gravity, UA: 1.01 (ref 1.005–1.030)
Urobilinogen, Ur: 0.2 mg/dL (ref 0.2–1.0)
pH, UA: 6 (ref 5.0–7.5)

## 2024-02-22 MED ORDER — ALFUZOSIN HCL ER 10 MG PO TB24: 10.0000 mg | ORAL_TABLET | Freq: Two times a day (BID) | ORAL | 3 refills | Status: DC

## 2024-02-22 NOTE — Progress Notes (Signed)
 02/22/2024 11:46 AM   Derrick Murray 1961/07/23 [id number]  Referring provider: Isabella Bowens, MD [address] [place],  [place] [address]  Prostate cancer   HPI: [name] is a 63yo here for followup for prostate cancer and BPH with nocturia. IPSS 13 QOL 2 on uroxatral 10mg  BID. PSA remains undetectable. Nocturia 3x. Urine stream usually strong. No straining to urinate.   PMH: Past Medical History:  Diagnosis Date   Anxiety    Arthritis    Coronary artery disease    Diabetes mellitus without complication (HCC)    type2   GERD (gastroesophageal reflux disease)    Hyperlipidemia    Hypertension    Prostate cancer (HCC)    Wears glasses    Wears partial dentures    lower    Surgical History: Past Surgical History:  Procedure Laterality Date   CARDIOVERSION N/A 06/28/2021   Procedure: CARDIOVERSION;  Surgeon: Orpah Cobb, MD;  Location: [facility] ENDOSCOPY;  Service: Cardiovascular;  Laterality: N/A;   CARDIOVERSION N/A 12/02/2021   Procedure: CARDIOVERSION;  Surgeon: Orpah Cobb, MD;  Location: [facility]OS[facility];  Service: Cardiovascular;  Laterality: N/A;   CORONARY STENT INTERVENTION N/A 11/15/2021   Procedure: CORONARY STENT INTERVENTION;  Surgeon: Elder Negus, MD;  Location: [facility];  Service: Cardiovascular;  Laterality: N/A;   GOLD SEED IMPLANT N/A 01/10/2021   Procedure: GOLD SEED IMPLANT;  Surgeon: Malen Gauze, MD;  Location: [facility];  Service: Urology;  Laterality: N/A;   HERNIA REPAIR  yrs ago   umbilical hernia   LEFT HEART CATH AND CORONARY ANGIOGRAPHY N/A 11/15/2021   Procedure: LEFT HEART CATH AND CORONARY ANGIOGRAPHY;  Surgeon: Orpah Cobb, MD;  Location: [facility];  Service: Cardiovascular;  Laterality: N/A;   LEFT HEART CATH AND CORONARY ANGIOGRAPHY N/A 12/01/2021   Procedure: LEFT HEART CATH AND CORONARY ANGIOGRAPHY;  Surgeon: Orpah Cobb, MD;  Location: [facility];  Service:  Cardiovascular;  Laterality: N/A;   PROSTATE BIOPSY  11/2020   SPACE OAR INSTILLATION N/A 01/10/2021   Procedure: SPACE OAR INSTILLATION;  Surgeon: Malen Gauze, MD;  Location: [facility];  Service: Urology;  Laterality: N/A;   TENDON REPAIR Left early 2021   left arm    Home Medications:  Allergies as of 02/22/2024       Reactions   Penicillins Swelling, Rash        Medication List        Accurate as of February 22, 2024 11:46 AM. If you have any questions, ask your nurse or doctor.          alfuzosin 10 MG 24 hr tablet Commonly known as: UROXATRAL Take 1 tablet (10 mg total) by mouth in the morning and at bedtime.   ALPRAZolam 0.25 MG tablet Commonly known as: XANAX Take 1 tablet (0.25 mg total) by mouth at bedtime.   amiodarone 200 MG tablet Commonly known as: PACERONE Take by mouth.   apixaban 5 MG Tabs tablet Commonly known as: ELIQUIS Take 5 mg by mouth 2 (two) times daily.   aspirin EC 81 MG tablet Take 1 tablet (81 mg total) by mouth daily. Swallow whole. Stop medication after 30 days. Continue Eliquis and Clopidogrel.   atorvastatin 40 MG tablet Commonly known as: LIPITOR Take 40 mg by mouth in the morning.   benazepril 20 MG tablet Commonly known as: LOTENSIN Take 1 tablet (20 mg total) by mouth daily.   diltiazem 120  MG 24 hr capsule Commonly known as: CARDIZEM CD Take 1 capsule (120 mg total) by mouth daily.   doxycycline 100 MG capsule Commonly known as: VIBRAMYCIN Take 1 capsule (100 mg total) by mouth 2 (two) times daily.   DULoxetine 60 MG capsule Commonly known as: CYMBALTA 1 capsule Orally Twice a day   FLUoxetine 40 MG capsule Commonly known as: PROZAC Take 40 mg by mouth in the morning.   Jardiance 25 MG Tabs tablet Generic drug: empagliflozin Take 25 mg by mouth in the morning.   metFORMIN 1000 MG tablet Commonly known as: GLUCOPHAGE Take 1 tablet (1,000 mg total) by mouth 2 (two) times daily. Start from  Saturday-12/03/2021   metoprolol succinate 50 MG 24 hr tablet Commonly known as: TOPROL-XL Take 50 mg by mouth daily.   nitroGLYCERIN 0.4 MG SL tablet Commonly known as: NITROSTAT Place 1 tablet (0.4 mg total) under the tongue every 5 (five) minutes x 3 doses as needed for chest pain.   pantoprazole 40 MG tablet Commonly known as: PROTONIX Take 1 tablet (40 mg total) by mouth daily.   sucralfate 1 g tablet Commonly known as: CARAFATE Take 1 g by mouth 4 (four) times daily.   sulfamethoxazole-trimethoprim 800-160 MG tablet Commonly known as: BACTRIM DS TAKE 1 TABLET BY MOUTH TWICE A DAY   Trulicity 0.75 MG/0.5ML Soaj Generic drug: Dulaglutide Inject 0.5 mg into the skin once a week.        Allergies:  Allergies  Allergen Reactions   Penicillins Swelling and Rash    Family History: Family History  Problem Relation Age of Onset   Colon cancer Maternal Grandfather    Heart attack Maternal Grandfather    Breast cancer Neg Hx    Prostate cancer Neg Hx    Pancreatic cancer Neg Hx     Social History:  reports that he has never smoked. He quit smokeless tobacco use about 34 years ago.  His smokeless tobacco use included chew. He reports current alcohol use. He reports that he does not use drugs.  ROS: All other review of systems were reviewed and are negative except what is noted above in HPI  Physical Exam: BP 93/61   Pulse 71   Constitutional:  Alert and oriented, No acute distress. HEENT: [place] AT, moist mucus membranes.  Trachea midline, no masses. Cardiovascular: No clubbing, cyanosis, or edema. Respiratory: Normal respiratory effort, no increased work of breathing. GI: Abdomen is soft, nontender, nondistended, no abdominal masses GU: No CVA tenderness.  Lymph: No cervical or inguinal lymphadenopathy. Skin: No rashes, bruises or suspicious lesions. Neurologic: Grossly intact, no focal deficits, moving all 4 extremities. Psychiatric: Normal mood and  affect.  Laboratory Data: Lab Results  Component Value Date   WBC 6.1 12/02/2021   HGB 15.7 12/02/2021   HCT 46.0 12/02/2021   MCV 96.0 12/02/2021   PLT 187 12/02/2021    Lab Results  Component Value Date   CREATININE 1.01 12/02/2021    No results found for: "PSA"  Lab Results  Component Value Date   TESTOSTERONE 39 (L) 12/16/2020    Lab Results  Component Value Date   HGBA1C 7.5 (H) 11/16/2021    Urinalysis    Component Value Date/Time   COLORURINE YELLOW 07/15/2007 0841   APPEARANCEUR Clear 08/01/2023 0955   LABSPEC <1.005 (L) 07/15/2007 0841   PHURINE 6.0 07/15/2007 0841   GLUCOSEU 3+ (A) 08/01/2023 0955   HGBUR NEGATIVE 07/15/2007 0841   BILIRUBINUR Negative 08/01/2023 0955   Lavenia Atlas  NEGATIVE 07/15/2007 0841   PROTEINUR Negative 08/01/2023 0955   PROTEINUR NEGATIVE 07/15/2007 0841   UROBILINOGEN 0.2 07/15/2007 0841   NITRITE Negative 08/01/2023 0955   NITRITE NEGATIVE 07/15/2007 0841   LEUKOCYTESUR Negative 08/01/2023 0955    Lab Results  Component Value Date   LABMICR Comment 08/01/2023   WBCUA None seen 06/15/2021   LABEPIT None seen 06/15/2021   MUCUS Present 06/15/2021   BACTERIA None seen 06/15/2021    Pertinent Imaging:  No results found for this or any previous visit.  No results found for this or any previous visit.  No results found for this or any previous visit.  No results found for this or any previous visit.  No results found for this or any previous visit.  No results found for this or any previous visit.  No results found for this or any previous visit.  No results found for this or any previous visit.   Assessment & Plan:    1. Prostate cancer (HCC) (Primary) Followup 1 year with PSA - Urinalysis, Routine w reflex microscopic  2. Benign prostatic hyperplasia with urinary obstruction Continue uroxatral 10mg  BID  3. Weak urinary stream Uroxatral 10mg  BID   No follow-ups on file.  Wilkie Aye,  MD  [facility] [place]

## 2024-02-28 ENCOUNTER — Encounter: Payer: Self-pay | Admitting: Urology

## 2024-02-28 NOTE — Patient Instructions (Signed)

## 2024-04-09 ENCOUNTER — Telehealth: Payer: Self-pay

## 2024-04-09 DIAGNOSIS — N138 Other obstructive and reflux uropathy: Secondary | ICD-10-CM

## 2024-04-09 MED ORDER — ALFUZOSIN HCL ER 10 MG PO TB24: 10.0000 mg | ORAL_TABLET | Freq: Two times a day (BID) | ORAL | 3 refills | Status: AC

## 2024-04-09 NOTE — Telephone Encounter (Signed)
 Pt called to confirm [facility] change

## 2024-04-09 NOTE — Addendum Note (Signed)
 Addended by: Dyke Glasser on: 04/09/2024 10:29 AM   Modules accepted: Orders

## 2024-04-09 NOTE — Telephone Encounter (Signed)
 Called pt to verify pharmacy change lvm to call back and lvm if I do not answer the phone

## 2025-02-19 ENCOUNTER — Other Ambulatory Visit

## 2025-02-23 ENCOUNTER — Ambulatory Visit: Admitting: Urology
